# Patient Record
Sex: Female | Born: 1975 | Race: White | Hispanic: No | Marital: Married | Smoking: Never smoker
Health system: Southern US, Community
[De-identification: ages and names within clinical notes are randomized; demographics above are authoritative.]

## PROBLEM LIST (undated history)

## (undated) DIAGNOSIS — Z8042 Family history of malignant neoplasm of prostate: Secondary | ICD-10-CM

## (undated) DIAGNOSIS — Z808 Family history of malignant neoplasm of other organs or systems: Secondary | ICD-10-CM

## (undated) DIAGNOSIS — Z8049 Family history of malignant neoplasm of other genital organs: Secondary | ICD-10-CM

## (undated) DIAGNOSIS — Z800 Family history of malignant neoplasm of digestive organs: Secondary | ICD-10-CM

## (undated) HISTORY — DX: Family history of malignant neoplasm of digestive organs: Z80.0

## (undated) HISTORY — DX: Family history of malignant neoplasm of other organs or systems: Z80.8

## (undated) HISTORY — DX: Family history of malignant neoplasm of prostate: Z80.42

## (undated) HISTORY — DX: Family history of malignant neoplasm of other genital organs: Z80.49

## (undated) HISTORY — PX: MENISCUS REPAIR: SHX5179

---

## 1999-01-11 ENCOUNTER — Other Ambulatory Visit: Admission: RE | Admit: 1999-01-11 | Discharge: 1999-01-11 | Payer: Self-pay | Admitting: Obstetrics and Gynecology

## 2001-09-14 ENCOUNTER — Ambulatory Visit (HOSPITAL_COMMUNITY): Admission: RE | Admit: 2001-09-14 | Discharge: 2001-09-14 | Payer: Self-pay | Admitting: Obstetrics and Gynecology

## 2001-11-29 ENCOUNTER — Inpatient Hospital Stay (HOSPITAL_COMMUNITY): Admission: AD | Admit: 2001-11-29 | Discharge: 2001-12-02 | Payer: Self-pay | Admitting: Obstetrics and Gynecology

## 2001-12-04 ENCOUNTER — Encounter: Admission: RE | Admit: 2001-12-04 | Discharge: 2002-01-03 | Payer: Self-pay | Admitting: Obstetrics and Gynecology

## 2004-01-23 ENCOUNTER — Other Ambulatory Visit: Admission: RE | Admit: 2004-01-23 | Discharge: 2004-01-23 | Payer: Self-pay | Admitting: Obstetrics and Gynecology

## 2004-01-23 ENCOUNTER — Inpatient Hospital Stay (HOSPITAL_COMMUNITY): Admission: AD | Admit: 2004-01-23 | Discharge: 2004-01-23 | Payer: Self-pay | Admitting: Obstetrics and Gynecology

## 2004-06-11 ENCOUNTER — Ambulatory Visit (HOSPITAL_COMMUNITY): Admission: RE | Admit: 2004-06-11 | Discharge: 2004-06-11 | Payer: Self-pay | Admitting: Obstetrics and Gynecology

## 2004-08-30 ENCOUNTER — Inpatient Hospital Stay (HOSPITAL_COMMUNITY): Admission: AD | Admit: 2004-08-30 | Discharge: 2004-09-01 | Payer: Self-pay | Admitting: Obstetrics and Gynecology

## 2005-04-07 ENCOUNTER — Other Ambulatory Visit: Admission: RE | Admit: 2005-04-07 | Discharge: 2005-04-07 | Payer: Self-pay | Admitting: Obstetrics and Gynecology

## 2008-12-24 ENCOUNTER — Inpatient Hospital Stay (HOSPITAL_COMMUNITY): Admission: AD | Admit: 2008-12-24 | Discharge: 2008-12-26 | Payer: Self-pay | Admitting: Obstetrics and Gynecology

## 2010-07-30 LAB — RH IMMUNE GLOB WKUP(>/=20WKS)(NOT WOMEN'S HOSP): Fetal Screen: NEGATIVE

## 2010-07-30 LAB — CBC
HCT: 32.2 % — ABNORMAL LOW (ref 36.0–46.0)
HCT: 33.6 % — ABNORMAL LOW (ref 36.0–46.0)
HCT: 39 % (ref 36.0–46.0)
Hemoglobin: 11.2 g/dL — ABNORMAL LOW (ref 12.0–15.0)
Hemoglobin: 11.6 g/dL — ABNORMAL LOW (ref 12.0–15.0)
Hemoglobin: 13.3 g/dL (ref 12.0–15.0)
MCHC: 34.1 g/dL (ref 30.0–36.0)
MCHC: 34.4 g/dL (ref 30.0–36.0)
MCHC: 34.7 g/dL (ref 30.0–36.0)
MCV: 96.3 fL (ref 78.0–100.0)
MCV: 96.7 fL (ref 78.0–100.0)
MCV: 97.2 fL (ref 78.0–100.0)
Platelets: 113 10*3/uL — ABNORMAL LOW (ref 150–400)
Platelets: 81 10*3/uL — ABNORMAL LOW (ref 150–400)
Platelets: 92 10*3/uL — ABNORMAL LOW (ref 150–400)
RBC: 3.31 MIL/uL — ABNORMAL LOW (ref 3.87–5.11)
RBC: 3.47 MIL/uL — ABNORMAL LOW (ref 3.87–5.11)
RBC: 4.05 MIL/uL (ref 3.87–5.11)
RDW: 13.1 % (ref 11.5–15.5)
RDW: 13.3 % (ref 11.5–15.5)
RDW: 13.3 % (ref 11.5–15.5)
WBC: 5.4 10*3/uL (ref 4.0–10.5)
WBC: 7.6 10*3/uL (ref 4.0–10.5)
WBC: 9.5 10*3/uL (ref 4.0–10.5)

## 2010-07-30 LAB — RPR: RPR Ser Ql: NONREACTIVE

## 2010-09-10 NOTE — Discharge Summary (Signed)
   NAME:  Sheryl [name], Sheryl [name]                      ACCOUNT NO.:  [id number]   MEDICAL RECORD NO.:  [id number]                   PATIENT TYPE:  INP   LOCATION:  9124                                 FACILITY:  [facility]   PHYSICIAN:  Randye Lobo, M.D.                DATE OF BIRTH:  [birth year]   DATE OF ADMISSION:  11/29/2001  DATE OF DISCHARGE:  12/02/2001                                 DISCHARGE SUMMARY   FINAL DIAGNOSES:  1. Intrauterine gestation at 38-2/7ths weeks' gestation.  2. Maternal exhaustion.  3. Chorioamnionitis.   PROCEDURE:  Vacuum-assisted vaginal delivery with repair of second-degree  laceration.   SURGEON:  Randye Lobo, M.D.   COMPLICATIONS:  None.   HISTORY:  This 35 year old G1, P0 presented at 38+ weeks' gestation with  spontaneous rupture of membranes.  Patient was admitted and was about 1 cm  dilated at this point.  She progressed though the latent phase of labor and  did receive an epidural for anesthesia.  At around 8:55 that morning she was  9 cm dilated.  At this point, labor was augmented with Pitocin.  The patient  dilated complete and heart tracing remained reactive at this time.  Patient  did develop a low-grade temperature of about 100 degrees and her fever  persisted during this time.  After pushing for about 2 1/2 hours, the  patient developed maternal exhaustion and the fetal heart rate declined to  about the 170's.  At this time, the patient was diagnosed with developing  chorioamnionitis and decision was made to proceed with vacuum-assisted  delivery.  At this point, Dr. Conley Simmonds used the vacuum extractor.  The  baby was at the +3 station in the right occipital anterior position.  The  baby was delivered with two contractions and there was second-degree  laceration that was repaired.  The delivery was without complications.  The  patient's postpartum course was benign without significant fevers.  She did  have some gestational  thrombocytopenia.  The baby was circumcised before  discharge.  She was felt ready for discharge on postoperative day 2.   DISCHARGE INSTRUCTIONS:  1. She was sent home on a regular diet.  2. She was told to decrease activities.  3. She was told to continue prenatal vitamins.  4. She was told to use over-the-counter ibuprofen as needed for pain.   FOLLOW UP:  She is to follow up in the office in four weeks.      Michelle B. Bigelman, P.A.-C.             Randye Lobo, M.D.    MBB/MEDQ  D:  01/17/2002  T:  01/17/2002  Job:  [id number]

## 2010-09-10 NOTE — Op Note (Signed)
NAME:  Sheryl [name], Sheryl [name]                      ACCOUNT NO.:  [id number]   MEDICAL RECORD NO.:  [id number]                   PATIENT TYPE:  INP   LOCATION:  9124                                 FACILITY:  [facility]   PHYSICIAN:  Randye Lobo, M.D.                DATE OF BIRTH:  June 06, 1975   DATE OF PROCEDURE:  11/30/2001  DATE OF DISCHARGE:  12/02/2001                                 OPERATIVE REPORT   PREOPERATIVE DIAGNOSES:  1. Intrauterine gestation at 12 plus 2 weeks.  2. Maternal exhaustion.  3. Chorioamnionitis.   POSTOPERATIVE DIAGNOSES:  1. Intrauterine gestation at 51 plus 2 weeks.  2. Maternal exhaustion.  3. Chorioamnionitis.   PROCEDURE:  Vacuum assisted vaginal delivery with repair of second degree  laceration.   SURGEON:  Randye Lobo, M.D.   ANESTHESIA:  Epidural, local with 1% lidocaine.   ESTIMATED BLOOD LOSS:  300 cc.   URINE OUTPUT:  60 cc prior to procedure.   COMPLICATIONS:  None.   INDICATIONS FOR PROCEDURE:  The patient was a 35 year old, gravida 1, para 35  Caucasian female at 54 plus [redacted] weeks gestation, who presented on November 29, 2001 with spontaneous rupture of membranes at 18:45 on November 29, 2001. The  patient was admitted to the [facility] and had an initial cervical  examination of 1 cm of dilatation. The patient went on to progress  spontaneously through the latent phase of labor and did receive an epidural  for anesthesia. At 8:55 a.m. on November 30, 2001, the patient's cervix was  noted to be 9 cm dilated. At this time, the labor was augmented with Pitocin  intravenously. The fetal heart rate tracing throughout the patient's labor  remained reactive and reassuring. The patient achieved complete cervical  dilatation at 10:27 at which time she was noted to have a low grade fever of  100 degrees. The patient did begin pushing, and her fever was persistent  during this time. After pushing for approximately 2 1/2 hours, the patient  was  noted to develop maternal exhaustion, and the fetal heart rate was noted  to climb into the 170s. The patient was diagnosed with developing  chorioamniotic, and a recommendation was made to proceed with a vacuum  assisted vaginal delivery. The patient and her husband consent to the  procedure after the risks and benefits were reviewed.   FINDINGS:  The vertex was noted to be at the +3 station in the right occiput  anterior position.   A viable female infant was delivered at 13:18 over a second degree laceration  with Apgar of 8 at 1 minute and 9 at 5 minutes. The infant was noted to be  vigorous at birth. The placenta was noted to be intact with a succenturiate  lobe and a normal insertion of a three vessel cord.   DESCRIPTION OF PROCEDURE:  The patient's perineum was sterilely prepped and  draped, and a red rubber catheter was used to empty the patient's bladder.  The fetal vertex position was reconfirmed at this time. The patient was  injected along the perineum with 1% lidocaine. The mighty vac was then  placed over the fetal vertex, and with two contractions, and minimal effort,  the newborn was delivered over a second degree laceration. The cord was  doubly clamped and cut at this time. The newborn remained with the mother.   The placenta delivered spontaneously. The patient did receive Pitocin 20  units intravenously. The cervix and vagina were without lacerations and a  second degree perineal laceration was confirmed which was repaired in  standard fashion with #0 and 2-0 Vicryl. There was a left periurethral  oblation which was not bleeding and therefore was not repaired.   This concluded the patient's procedure. There were no complications. All  needle, instrument, and sponge counts were correct.                                                 Randye Lobo, M.D.    BES/MEDQ  D:  11/30/2001  T:  12/04/2001  Job:  [id number]

## 2016-01-29 DIAGNOSIS — Z01419 Encounter for gynecological examination (general) (routine) without abnormal findings: Secondary | ICD-10-CM | POA: Diagnosis not present

## 2016-01-29 DIAGNOSIS — Z681 Body mass index (BMI) 19 or less, adult: Secondary | ICD-10-CM | POA: Diagnosis not present

## 2016-02-11 DIAGNOSIS — M5410 Radiculopathy, site unspecified: Secondary | ICD-10-CM | POA: Diagnosis not present

## 2016-02-11 DIAGNOSIS — R0781 Pleurodynia: Secondary | ICD-10-CM | POA: Diagnosis not present

## 2016-02-11 DIAGNOSIS — Z23 Encounter for immunization: Secondary | ICD-10-CM | POA: Diagnosis not present

## 2016-02-11 DIAGNOSIS — R200 Anesthesia of skin: Secondary | ICD-10-CM | POA: Diagnosis not present

## 2016-02-12 DIAGNOSIS — Z1231 Encounter for screening mammogram for malignant neoplasm of breast: Secondary | ICD-10-CM | POA: Diagnosis not present

## 2016-02-12 DIAGNOSIS — Z681 Body mass index (BMI) 19 or less, adult: Secondary | ICD-10-CM | POA: Diagnosis not present

## 2016-02-17 ENCOUNTER — Other Ambulatory Visit: Payer: Self-pay | Admitting: Obstetrics and Gynecology

## 2016-02-17 DIAGNOSIS — R928 Other abnormal and inconclusive findings on diagnostic imaging of breast: Secondary | ICD-10-CM

## 2016-02-19 ENCOUNTER — Ambulatory Visit
Admission: RE | Admit: 2016-02-19 | Discharge: 2016-02-19 | Disposition: A | Discharge status: Home / Self Care | Payer: BLUE CROSS/BLUE SHIELD | Source: Ambulatory Visit | Attending: Obstetrics and Gynecology | Admitting: Obstetrics and Gynecology

## 2016-02-19 DIAGNOSIS — R928 Other abnormal and inconclusive findings on diagnostic imaging of breast: Secondary | ICD-10-CM

## 2016-02-19 DIAGNOSIS — N6320 Unspecified lump in the left breast, unspecified quadrant: Secondary | ICD-10-CM | POA: Diagnosis not present

## 2016-07-21 DIAGNOSIS — D2261 Melanocytic nevi of right upper limb, including shoulder: Secondary | ICD-10-CM | POA: Diagnosis not present

## 2016-07-21 DIAGNOSIS — D2262 Melanocytic nevi of left upper limb, including shoulder: Secondary | ICD-10-CM | POA: Diagnosis not present

## 2016-07-21 DIAGNOSIS — L570 Actinic keratosis: Secondary | ICD-10-CM | POA: Diagnosis not present

## 2016-07-21 DIAGNOSIS — D1801 Hemangioma of skin and subcutaneous tissue: Secondary | ICD-10-CM | POA: Diagnosis not present

## 2016-07-21 DIAGNOSIS — D225 Melanocytic nevi of trunk: Secondary | ICD-10-CM | POA: Diagnosis not present

## 2016-08-23 DIAGNOSIS — H5212 Myopia, left eye: Secondary | ICD-10-CM | POA: Diagnosis not present

## 2016-08-23 DIAGNOSIS — H52223 Regular astigmatism, bilateral: Secondary | ICD-10-CM | POA: Diagnosis not present

## 2017-02-28 DIAGNOSIS — Z01419 Encounter for gynecological examination (general) (routine) without abnormal findings: Secondary | ICD-10-CM | POA: Diagnosis not present

## 2017-02-28 DIAGNOSIS — Z681 Body mass index (BMI) 19 or less, adult: Secondary | ICD-10-CM | POA: Diagnosis not present

## 2017-02-28 DIAGNOSIS — Z1231 Encounter for screening mammogram for malignant neoplasm of breast: Secondary | ICD-10-CM | POA: Diagnosis not present

## 2017-06-22 DIAGNOSIS — M2391 Unspecified internal derangement of right knee: Secondary | ICD-10-CM | POA: Diagnosis not present

## 2017-06-22 DIAGNOSIS — M25661 Stiffness of right knee, not elsewhere classified: Secondary | ICD-10-CM | POA: Diagnosis not present

## 2017-06-22 DIAGNOSIS — M25561 Pain in right knee: Secondary | ICD-10-CM | POA: Diagnosis not present

## 2017-06-24 DIAGNOSIS — S83251A Bucket-handle tear of lateral meniscus, current injury, right knee, initial encounter: Secondary | ICD-10-CM | POA: Diagnosis not present

## 2017-06-24 DIAGNOSIS — Q686 Discoid meniscus: Secondary | ICD-10-CM | POA: Diagnosis not present

## 2017-06-24 DIAGNOSIS — M25461 Effusion, right knee: Secondary | ICD-10-CM | POA: Diagnosis not present

## 2017-06-24 DIAGNOSIS — M25561 Pain in right knee: Secondary | ICD-10-CM | POA: Diagnosis not present

## 2017-07-05 DIAGNOSIS — M25561 Pain in right knee: Secondary | ICD-10-CM | POA: Diagnosis not present

## 2017-07-05 DIAGNOSIS — S83251D Bucket-handle tear of lateral meniscus, current injury, right knee, subsequent encounter: Secondary | ICD-10-CM | POA: Diagnosis not present

## 2017-07-25 DIAGNOSIS — D2371 Other benign neoplasm of skin of right lower limb, including hip: Secondary | ICD-10-CM | POA: Diagnosis not present

## 2017-07-25 DIAGNOSIS — L819 Disorder of pigmentation, unspecified: Secondary | ICD-10-CM | POA: Diagnosis not present

## 2017-07-25 DIAGNOSIS — D2261 Melanocytic nevi of right upper limb, including shoulder: Secondary | ICD-10-CM | POA: Diagnosis not present

## 2017-07-25 DIAGNOSIS — D2262 Melanocytic nevi of left upper limb, including shoulder: Secondary | ICD-10-CM | POA: Diagnosis not present

## 2017-11-30 DIAGNOSIS — K625 Hemorrhage of anus and rectum: Secondary | ICD-10-CM | POA: Diagnosis not present

## 2017-12-12 DIAGNOSIS — K648 Other hemorrhoids: Secondary | ICD-10-CM | POA: Diagnosis not present

## 2017-12-12 DIAGNOSIS — Z8371 Family history of colonic polyps: Secondary | ICD-10-CM | POA: Diagnosis not present

## 2018-01-16 DIAGNOSIS — K625 Hemorrhage of anus and rectum: Secondary | ICD-10-CM | POA: Diagnosis not present

## 2018-01-26 DIAGNOSIS — M25561 Pain in right knee: Secondary | ICD-10-CM | POA: Diagnosis not present

## 2018-02-20 DIAGNOSIS — Z130 Encounter for screening for diseases of the blood and blood-forming organs and certain disorders involving the immune mechanism: Secondary | ICD-10-CM | POA: Diagnosis not present

## 2018-02-20 DIAGNOSIS — Z13220 Encounter for screening for lipoid disorders: Secondary | ICD-10-CM | POA: Diagnosis not present

## 2018-02-20 DIAGNOSIS — Z23 Encounter for immunization: Secondary | ICD-10-CM | POA: Diagnosis not present

## 2018-02-20 DIAGNOSIS — Z0000 Encounter for general adult medical examination without abnormal findings: Secondary | ICD-10-CM | POA: Diagnosis not present

## 2018-03-08 DIAGNOSIS — W502XXA Accidental twist by another person, initial encounter: Secondary | ICD-10-CM | POA: Diagnosis not present

## 2018-03-08 DIAGNOSIS — S83281A Other tear of lateral meniscus, current injury, right knee, initial encounter: Secondary | ICD-10-CM | POA: Diagnosis not present

## 2018-05-02 DIAGNOSIS — M25561 Pain in right knee: Secondary | ICD-10-CM | POA: Diagnosis not present

## 2018-05-02 DIAGNOSIS — M25661 Stiffness of right knee, not elsewhere classified: Secondary | ICD-10-CM | POA: Diagnosis not present

## 2018-05-16 DIAGNOSIS — M2391 Unspecified internal derangement of right knee: Secondary | ICD-10-CM | POA: Diagnosis not present

## 2018-05-16 DIAGNOSIS — M25561 Pain in right knee: Secondary | ICD-10-CM | POA: Diagnosis not present

## 2018-05-16 DIAGNOSIS — M25661 Stiffness of right knee, not elsewhere classified: Secondary | ICD-10-CM | POA: Diagnosis not present

## 2018-05-30 DIAGNOSIS — M25561 Pain in right knee: Secondary | ICD-10-CM | POA: Diagnosis not present

## 2018-05-30 DIAGNOSIS — M25661 Stiffness of right knee, not elsewhere classified: Secondary | ICD-10-CM | POA: Diagnosis not present

## 2018-06-20 DIAGNOSIS — Z681 Body mass index (BMI) 19 or less, adult: Secondary | ICD-10-CM | POA: Diagnosis not present

## 2018-06-20 DIAGNOSIS — Z1231 Encounter for screening mammogram for malignant neoplasm of breast: Secondary | ICD-10-CM | POA: Diagnosis not present

## 2018-06-20 DIAGNOSIS — Z01419 Encounter for gynecological examination (general) (routine) without abnormal findings: Secondary | ICD-10-CM | POA: Diagnosis not present

## 2018-06-20 DIAGNOSIS — Z124 Encounter for screening for malignant neoplasm of cervix: Secondary | ICD-10-CM | POA: Diagnosis not present

## 2018-07-31 DIAGNOSIS — D225 Melanocytic nevi of trunk: Secondary | ICD-10-CM | POA: Diagnosis not present

## 2018-07-31 DIAGNOSIS — D2371 Other benign neoplasm of skin of right lower limb, including hip: Secondary | ICD-10-CM | POA: Diagnosis not present

## 2018-07-31 DIAGNOSIS — L84 Corns and callosities: Secondary | ICD-10-CM | POA: Diagnosis not present

## 2018-07-31 DIAGNOSIS — D2372 Other benign neoplasm of skin of left lower limb, including hip: Secondary | ICD-10-CM | POA: Diagnosis not present

## 2019-02-25 DIAGNOSIS — Z13220 Encounter for screening for lipoid disorders: Secondary | ICD-10-CM | POA: Diagnosis not present

## 2019-02-25 DIAGNOSIS — Z23 Encounter for immunization: Secondary | ICD-10-CM | POA: Diagnosis not present

## 2019-02-25 DIAGNOSIS — Z131 Encounter for screening for diabetes mellitus: Secondary | ICD-10-CM | POA: Diagnosis not present

## 2019-02-25 DIAGNOSIS — Z0000 Encounter for general adult medical examination without abnormal findings: Secondary | ICD-10-CM | POA: Diagnosis not present

## 2019-05-01 DIAGNOSIS — Z1159 Encounter for screening for other viral diseases: Secondary | ICD-10-CM | POA: Diagnosis not present

## 2019-07-29 DIAGNOSIS — N898 Other specified noninflammatory disorders of vagina: Secondary | ICD-10-CM | POA: Diagnosis not present

## 2019-07-29 DIAGNOSIS — Z681 Body mass index (BMI) 19 or less, adult: Secondary | ICD-10-CM | POA: Diagnosis not present

## 2019-07-29 DIAGNOSIS — Z01419 Encounter for gynecological examination (general) (routine) without abnormal findings: Secondary | ICD-10-CM | POA: Diagnosis not present

## 2019-07-29 DIAGNOSIS — Z1231 Encounter for screening mammogram for malignant neoplasm of breast: Secondary | ICD-10-CM | POA: Diagnosis not present

## 2019-08-14 DIAGNOSIS — D2262 Melanocytic nevi of left upper limb, including shoulder: Secondary | ICD-10-CM | POA: Diagnosis not present

## 2019-08-14 DIAGNOSIS — D2261 Melanocytic nevi of right upper limb, including shoulder: Secondary | ICD-10-CM | POA: Diagnosis not present

## 2019-08-14 DIAGNOSIS — D2372 Other benign neoplasm of skin of left lower limb, including hip: Secondary | ICD-10-CM | POA: Diagnosis not present

## 2019-08-14 DIAGNOSIS — D225 Melanocytic nevi of trunk: Secondary | ICD-10-CM | POA: Diagnosis not present

## 2019-08-30 DIAGNOSIS — Z20828 Contact with and (suspected) exposure to other viral communicable diseases: Secondary | ICD-10-CM | POA: Diagnosis not present

## 2019-08-30 DIAGNOSIS — Z03818 Encounter for observation for suspected exposure to other biological agents ruled out: Secondary | ICD-10-CM | POA: Diagnosis not present

## 2019-09-03 DIAGNOSIS — Z03818 Encounter for observation for suspected exposure to other biological agents ruled out: Secondary | ICD-10-CM | POA: Diagnosis not present

## 2019-12-21 ENCOUNTER — Ambulatory Visit: Payer: Self-pay | Attending: Internal Medicine

## 2019-12-21 DIAGNOSIS — Z23 Encounter for immunization: Secondary | ICD-10-CM

## 2019-12-21 NOTE — Progress Notes (Signed)
   Covid-19 Vaccination Clinic  Name:  Sheryl [name]    MRN: [id number] DOB: 1975/07/04  12/21/2019  Sheryl [name] was observed post Covid-19 immunization for 15 minutes without incident. She was provided with Vaccine Information Sheet and instruction to access the V-Safe system.   Sheryl [name] was instructed to call 911 with any severe reactions post vaccine: Marland Kitchen Difficulty breathing  . Swelling of face and throat  . A fast heartbeat  . A bad rash all over body  . Dizziness and weakness   Immunizations Administered    Name Date Dose VIS Date Route   Pfizer COVID-19 Vaccine 12/21/2019 12:24 PM 0.3 mL 06/19/2018 Intramuscular   Manufacturer: [employer], [employer]   Lot: [id number]   NDC: [address]-7

## 2020-01-13 ENCOUNTER — Ambulatory Visit: Payer: Self-pay | Attending: Internal Medicine

## 2020-01-13 DIAGNOSIS — Z23 Encounter for immunization: Secondary | ICD-10-CM

## 2020-01-13 NOTE — Progress Notes (Signed)
   Covid-19 Vaccination Clinic  Name:  Sheryl [name]    MRN: [id number] DOB: [birth year]  01/13/2020  [name] was observed post Covid-19 immunization for 15 minutes without incident. She was provided with Vaccine Information Sheet and instruction to access the V-Safe system.  Vaccinated by Fredirick Maudlin  [name] was instructed to call 911 with any severe reactions post vaccine: Marland Kitchen Difficulty breathing  . Swelling of face and throat  . A fast heartbeat  . A bad rash all over body  . Dizziness and weakness   Immunizations Administered    Name Date Dose VIS Date Route   Pfizer COVID-19 Vaccine 01/13/2020 12:53 PM 0.3 mL 06/19/2018 Intramuscular   Manufacturer: [employer], [employer]   Lot: [id number]   NDC: [id number]

## 2020-01-17 MED FILL — PFIZER-BIONTECH COVID-19 VA: 30 | 1 days supply | Qty: 0 | Fill #0

## 2020-03-12 DIAGNOSIS — Z131 Encounter for screening for diabetes mellitus: Secondary | ICD-10-CM | POA: Diagnosis not present

## 2020-03-12 DIAGNOSIS — Z23 Encounter for immunization: Secondary | ICD-10-CM | POA: Diagnosis not present

## 2020-03-12 DIAGNOSIS — Z13220 Encounter for screening for lipoid disorders: Secondary | ICD-10-CM | POA: Diagnosis not present

## 2020-03-12 DIAGNOSIS — Z0000 Encounter for general adult medical examination without abnormal findings: Secondary | ICD-10-CM | POA: Diagnosis not present

## 2020-07-28 DIAGNOSIS — D224 Melanocytic nevi of scalp and neck: Secondary | ICD-10-CM | POA: Diagnosis not present

## 2020-07-28 DIAGNOSIS — D2261 Melanocytic nevi of right upper limb, including shoulder: Secondary | ICD-10-CM | POA: Diagnosis not present

## 2020-07-28 DIAGNOSIS — D225 Melanocytic nevi of trunk: Secondary | ICD-10-CM | POA: Diagnosis not present

## 2020-07-28 DIAGNOSIS — D2262 Melanocytic nevi of left upper limb, including shoulder: Secondary | ICD-10-CM | POA: Diagnosis not present

## 2020-08-11 DIAGNOSIS — Z1231 Encounter for screening mammogram for malignant neoplasm of breast: Secondary | ICD-10-CM | POA: Diagnosis not present

## 2020-08-11 DIAGNOSIS — Z01419 Encounter for gynecological examination (general) (routine) without abnormal findings: Secondary | ICD-10-CM | POA: Diagnosis not present

## 2020-08-11 DIAGNOSIS — Z681 Body mass index (BMI) 19 or less, adult: Secondary | ICD-10-CM | POA: Diagnosis not present

## 2020-08-12 ENCOUNTER — Other Ambulatory Visit: Payer: Self-pay | Admitting: Obstetrics and Gynecology

## 2020-08-12 DIAGNOSIS — R928 Other abnormal and inconclusive findings on diagnostic imaging of breast: Secondary | ICD-10-CM

## 2020-08-21 ENCOUNTER — Ambulatory Visit
Admission: RE | Admit: 2020-08-21 | Discharge: 2020-08-21 | Disposition: A | Discharge status: Home / Self Care | Payer: BC Managed Care – PPO | Source: Ambulatory Visit | Attending: Obstetrics and Gynecology | Admitting: Obstetrics and Gynecology

## 2020-08-21 ENCOUNTER — Other Ambulatory Visit: Payer: Self-pay

## 2020-08-21 DIAGNOSIS — R922 Inconclusive mammogram: Secondary | ICD-10-CM | POA: Diagnosis not present

## 2020-08-21 DIAGNOSIS — R928 Other abnormal and inconclusive findings on diagnostic imaging of breast: Secondary | ICD-10-CM

## 2020-08-21 DIAGNOSIS — N6011 Diffuse cystic mastopathy of right breast: Secondary | ICD-10-CM | POA: Diagnosis not present

## 2020-08-21 DIAGNOSIS — N6012 Diffuse cystic mastopathy of left breast: Secondary | ICD-10-CM | POA: Diagnosis not present

## 2020-09-04 ENCOUNTER — Other Ambulatory Visit: Payer: Self-pay

## 2020-11-08 ENCOUNTER — Emergency Department (HOSPITAL_COMMUNITY): Payer: BC Managed Care – PPO

## 2020-11-08 ENCOUNTER — Other Ambulatory Visit: Payer: Self-pay

## 2020-11-08 ENCOUNTER — Emergency Department (HOSPITAL_COMMUNITY)
Admission: EM | Admit: 2020-11-08 | Discharge: 2020-11-08 | Disposition: A | Discharge status: Home / Self Care | Payer: BC Managed Care – PPO | Attending: Emergency Medicine | Admitting: Emergency Medicine

## 2020-11-08 ENCOUNTER — Encounter (HOSPITAL_COMMUNITY): Payer: Self-pay

## 2020-11-08 DIAGNOSIS — N201 Calculus of ureter: Secondary | ICD-10-CM | POA: Diagnosis not present

## 2020-11-08 DIAGNOSIS — R109 Unspecified abdominal pain: Secondary | ICD-10-CM | POA: Diagnosis not present

## 2020-11-08 DIAGNOSIS — K449 Diaphragmatic hernia without obstruction or gangrene: Secondary | ICD-10-CM | POA: Diagnosis not present

## 2020-11-08 DIAGNOSIS — N9489 Other specified conditions associated with female genital organs and menstrual cycle: Secondary | ICD-10-CM | POA: Diagnosis not present

## 2020-11-08 DIAGNOSIS — N132 Hydronephrosis with renal and ureteral calculous obstruction: Secondary | ICD-10-CM | POA: Diagnosis not present

## 2020-11-08 LAB — COMPREHENSIVE METABOLIC PANEL
ALT: 16 U/L (ref 0–44)
AST: 17 U/L (ref 15–41)
Albumin: 4.4 g/dL (ref 3.5–5.0)
Alkaline Phosphatase: 36 U/L — ABNORMAL LOW (ref 38–126)
Anion gap: 7 (ref 5–15)
BUN: 13 mg/dL (ref 6–20)
CO2: 26 mmol/L (ref 22–32)
Calcium: 9.3 mg/dL (ref 8.9–10.3)
Chloride: 107 mmol/L (ref 98–111)
Creatinine, Ser: 0.76 mg/dL (ref 0.44–1.00)
GFR, Estimated: >60 mL/min (ref >60)
Glucose, Bld: 118 mg/dL — ABNORMAL HIGH (ref 70–99)
Potassium: 4 mmol/L (ref 3.5–5.1)
Sodium: 140 mmol/L (ref 135–145)
Total Bilirubin: 0.5 mg/dL (ref 0.3–1.2)
Total Protein: 7.3 g/dL (ref 6.5–8.1)

## 2020-11-08 LAB — URINALYSIS, ROUTINE W REFLEX MICROSCOPIC
Bacteria, UA: NONE SEEN
Bilirubin Urine: NEGATIVE
Glucose, UA: NEGATIVE mg/dL
Ketones, ur: 80 mg/dL — AB
Leukocytes,Ua: NEGATIVE
Nitrite: NEGATIVE
Protein, ur: NEGATIVE mg/dL
RBC / HPF: >50 RBC/hpf — ABNORMAL HIGH (ref 0–5)
Specific Gravity, Urine: 1.025 (ref 1.005–1.030)
pH: 5 (ref 5.0–8.0)

## 2020-11-08 LAB — CBC
HCT: 41.5 % (ref 36.0–46.0)
Hemoglobin: 13.4 g/dL (ref 12.0–15.0)
MCH: 30.8 pg (ref 26.0–34.0)
MCHC: 32.3 g/dL (ref 30.0–36.0)
MCV: 95.4 fL (ref 80.0–100.0)
Platelets: 208 10*3/uL (ref 150–400)
RBC: 4.35 MIL/uL (ref 3.87–5.11)
RDW: 13 % (ref 11.5–15.5)
WBC: 12.8 10*3/uL — ABNORMAL HIGH (ref 4.0–10.5)
nRBC: 0 % (ref 0.0–0.2)

## 2020-11-08 LAB — I-STAT BETA HCG BLOOD, ED (MC, WL, AP ONLY): I-stat hCG, quantitative: <5 m[IU]/mL (ref <5)

## 2020-11-08 MED ORDER — ONDANSETRON HCL 4 MG/2ML IJ SOLN
4.0000 mg | Freq: Once | INTRAMUSCULAR | Status: AC
  Administered 2020-11-08: 4 mg via INTRAVENOUS
  Filled 2020-11-08: qty 2

## 2020-11-08 MED ORDER — OXYCODONE-ACETAMINOPHEN 5-325 MG PO TABS: 1.0000 | ORAL_TABLET | Freq: Four times a day (QID) | ORAL | 0 refills | Status: DC | PRN

## 2020-11-08 MED ORDER — ONDANSETRON 8 MG PO TBDP: 8.0000 mg | ORAL_TABLET | Freq: Three times a day (TID) | ORAL | 0 refills | Status: DC | PRN

## 2020-11-08 MED ORDER — KETOROLAC TROMETHAMINE 15 MG/ML IJ SOLN
15.0000 mg | Freq: Once | INTRAMUSCULAR | Status: AC
  Administered 2020-11-08: 15 mg via INTRAVENOUS
  Filled 2020-11-08: qty 1

## 2020-11-08 MED ORDER — TAMSULOSIN HCL 0.4 MG PO CAPS: 0.4000 mg | ORAL_CAPSULE | Freq: Every day | ORAL | 0 refills | Status: DC

## 2020-11-08 NOTE — ED Triage Notes (Signed)
Pt reports severe left sided flank pain and vomiting that began about an hour ago.

## 2020-11-08 NOTE — ED Provider Notes (Signed)
[facility] [place] [facility]EMERGENCY DEPT Provider Note   CSN: [id number] Arrival date & time: 11/08/20  1927     History Chief Complaint  Patient presents with   Flank Pain    Sheryl [name] is a 45 y.o. female.  HPI 45 year old female presents today complaining of left flank pain.  She states this began about 1 hour ago.  She describes it as sharp in nature.  She has associated nausea and vomiting.  It is in the left flank and radiates down.  She denies any previous symptoms in the past.  She began her menstrual cycle today and is normal.  A normal time.  She does describe some "strange sensation around the external urethra.  However she denies any pain or frequency of urination.  She has not noted any blood or discolored urine.  Pain is only 8 out of 10.  She has no significant increasing or decreasing factors.  She attempted to take ibuprofen at home and feels that she likely vomited this.    History reviewed. No pertinent past medical history.  There are no problems to display for this patient.   History reviewed. No pertinent surgical history.   OB History   No obstetric history on file.     History reviewed. No pertinent family history.     Home Medications Prior to Admission medications   Not on File    Allergies    Patient has no known allergies.  Review of Systems   Review of Systems  All other systems reviewed and are negative.  Physical Exam Updated Vital Signs BP 109/67 (BP Location: Left Arm)   Pulse 71   Temp 97.9 F (36.6 C) (Oral)   Resp 16   Ht 1.676 m (5\' 6" )   Wt 45.4 kg   SpO2 100%   BMI 16.14 kg/m   Physical Exam Vitals and nursing note reviewed.  Constitutional:      Appearance: Normal appearance.  HENT:     Head: Normocephalic.     Right Ear: External ear normal.     Left Ear: External ear normal.     Nose: Nose normal.     Mouth/Throat:     Pharynx: Oropharynx is clear.  Eyes:     Pupils: Pupils are equal,  round, and reactive to light.  Cardiovascular:     Rate and Rhythm: Normal rate and regular rhythm.  Pulmonary:     Effort: Pulmonary effort is normal.     Breath sounds: Normal breath sounds.  Abdominal:     General: Abdomen is flat. Bowel sounds are normal. There is no distension.     Palpations: Abdomen is soft.     Tenderness: There is no abdominal tenderness.  Musculoskeletal:        General: Normal range of motion.     Cervical back: Normal range of motion.  Skin:    General: Skin is warm and dry.     Capillary Refill: Capillary refill takes less than 2 seconds.  Neurological:     General: No focal deficit present.     Mental Status: She is alert.  Psychiatric:        Mood and Affect: Mood normal.    ED Results / Procedures / Treatments   Labs (all labs ordered are listed, but only abnormal results are displayed) Labs Reviewed  URINALYSIS, ROUTINE W REFLEX MICROSCOPIC  CBC  COMPREHENSIVE METABOLIC PANEL  I-STAT BETA HCG BLOOD, ED ([facility], [facility], [facility] ONLY)  EKG None  Radiology No results found.  Procedures Procedures   Medications Ordered in ED Medications  ketorolac (TORADOL) 15 MG/ML injection 15 mg (has no administration in time range)  ondansetron (ZOFRAN) injection 4 mg (has no administration in time range)    ED Course  I have reviewed the triage vital signs and the nursing notes.  Pertinent labs & imaging results that were available during my care of the patient were reviewed by me and considered in my medical decision making (see chart for details).    MDM Rules/Calculators/A&P                          45 year old female presents today complaining of left flank pain.  Here she has hematuria and a 3 mm left ureteral stone.  She is treated symptomatically with Toradol and Zofran and feels improved.  Plan discharge to home with pain medications, antiemetics, and follow-up with urology. Final Clinical Impression(s) / ED Diagnoses Final diagnoses:  Left  ureteral stone    Rx / DC Orders ED Discharge Orders     None        Margarita Grizzle, MD 11/08/20 2151

## 2020-12-17 DIAGNOSIS — N202 Calculus of kidney with calculus of ureter: Secondary | ICD-10-CM | POA: Diagnosis not present

## 2021-01-08 DIAGNOSIS — N202 Calculus of kidney with calculus of ureter: Secondary | ICD-10-CM | POA: Diagnosis not present

## 2021-03-23 DIAGNOSIS — Z23 Encounter for immunization: Secondary | ICD-10-CM | POA: Diagnosis not present

## 2021-03-23 DIAGNOSIS — Z0000 Encounter for general adult medical examination without abnormal findings: Secondary | ICD-10-CM | POA: Diagnosis not present

## 2021-03-23 DIAGNOSIS — Z13220 Encounter for screening for lipoid disorders: Secondary | ICD-10-CM | POA: Diagnosis not present

## 2021-03-23 DIAGNOSIS — Z131 Encounter for screening for diabetes mellitus: Secondary | ICD-10-CM | POA: Diagnosis not present

## 2021-03-30 DIAGNOSIS — K635 Polyp of colon: Secondary | ICD-10-CM | POA: Diagnosis not present

## 2021-03-30 DIAGNOSIS — Z1211 Encounter for screening for malignant neoplasm of colon: Secondary | ICD-10-CM | POA: Diagnosis not present

## 2021-03-30 DIAGNOSIS — K648 Other hemorrhoids: Secondary | ICD-10-CM | POA: Diagnosis not present

## 2021-07-02 DIAGNOSIS — N202 Calculus of kidney with calculus of ureter: Secondary | ICD-10-CM | POA: Diagnosis not present

## 2021-07-28 DIAGNOSIS — D2261 Melanocytic nevi of right upper limb, including shoulder: Secondary | ICD-10-CM | POA: Diagnosis not present

## 2021-07-28 DIAGNOSIS — L720 Epidermal cyst: Secondary | ICD-10-CM | POA: Diagnosis not present

## 2021-07-28 DIAGNOSIS — D225 Melanocytic nevi of trunk: Secondary | ICD-10-CM | POA: Diagnosis not present

## 2021-07-28 DIAGNOSIS — D2262 Melanocytic nevi of left upper limb, including shoulder: Secondary | ICD-10-CM | POA: Diagnosis not present

## 2021-08-20 DIAGNOSIS — Z1231 Encounter for screening mammogram for malignant neoplasm of breast: Secondary | ICD-10-CM | POA: Diagnosis not present

## 2021-08-20 DIAGNOSIS — Z681 Body mass index (BMI) 19 or less, adult: Secondary | ICD-10-CM | POA: Diagnosis not present

## 2021-08-20 DIAGNOSIS — Z01419 Encounter for gynecological examination (general) (routine) without abnormal findings: Secondary | ICD-10-CM | POA: Diagnosis not present

## 2021-09-29 IMAGING — CT CT RENAL STONE PROTOCOL
2 of 4 series · 16 of 46 positions shown, 18 images · non-contrast
Comparison: None.

CLINICAL DATA: Severe left flank pain, vomiting

EXAM:
CT ABDOMEN AND PELVIS WITHOUT CONTRAST
TECHNIQUE: Multidetector CT imaging of the abdomen and pelvis was performed
following the standard protocol without IV contrast.

[Series 2: axial st · axial · 0.64mm/px · z∈[-491,-116]mm · 13 of 85 slices shown, 15 images]
[im 5/85  soft-tissue]
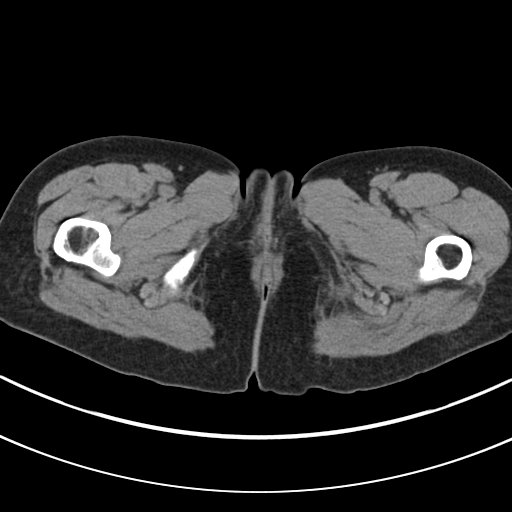
[im 5/85  bone]
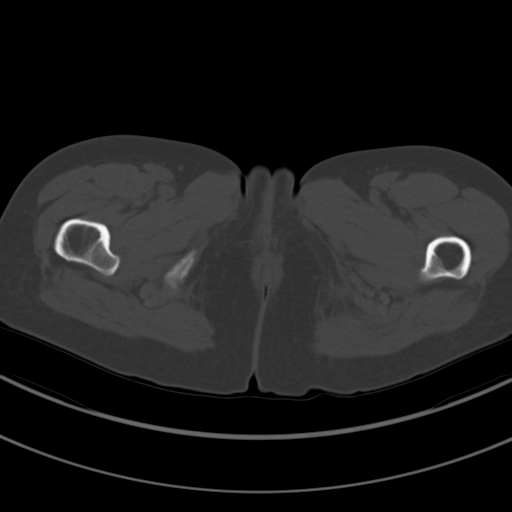
[im 14/85  soft-tissue]
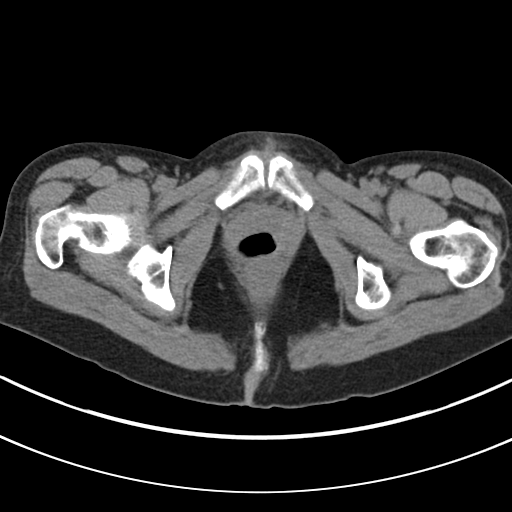
[im 18/85  soft-tissue]
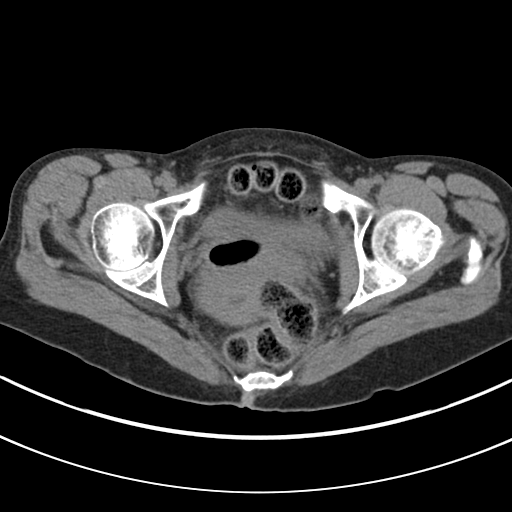
[im 23/85  soft-tissue]
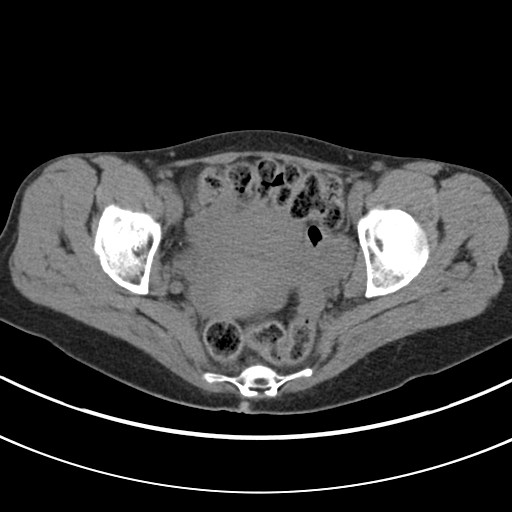
[im 31/85  soft-tissue]
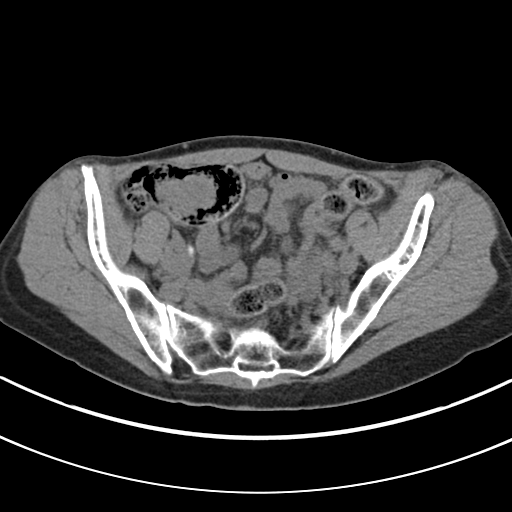
[im 36/85  soft-tissue]
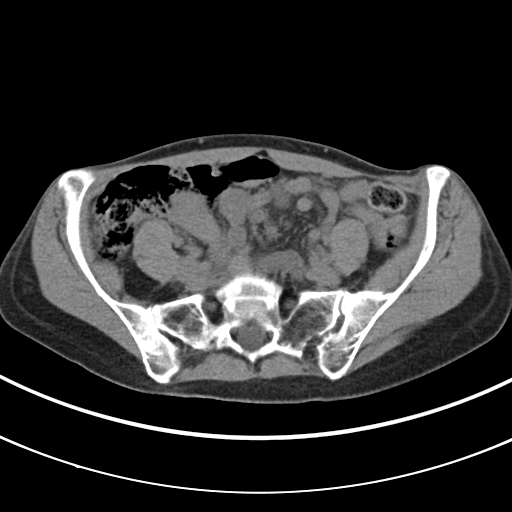
[im 45/85  soft-tissue]
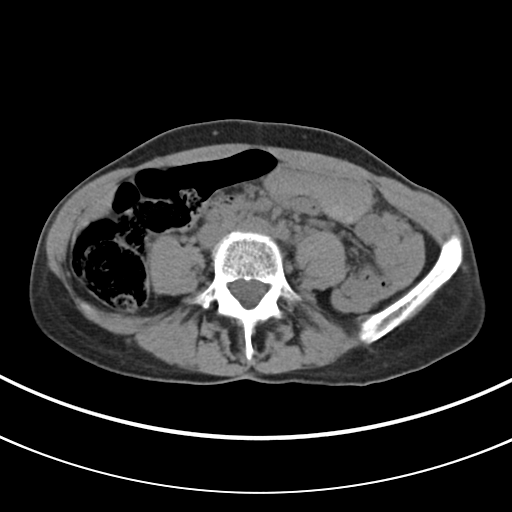
[im 49/85  soft-tissue]
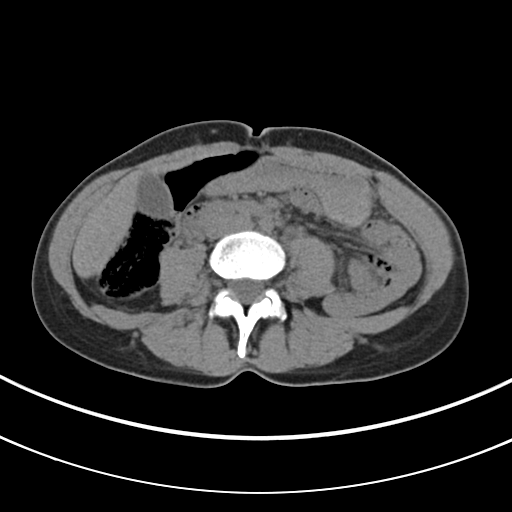
[im 54/85  soft-tissue]
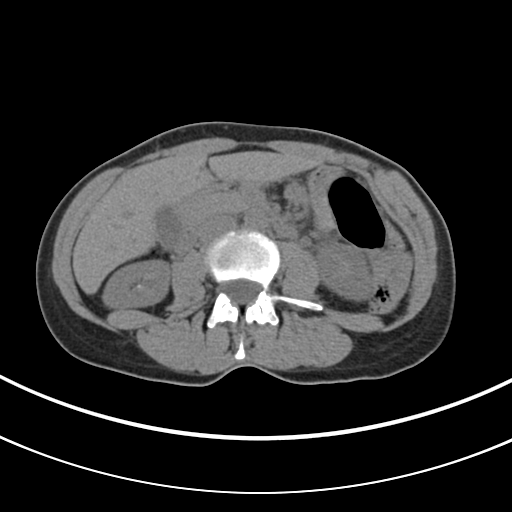
[im 54/85  bone]
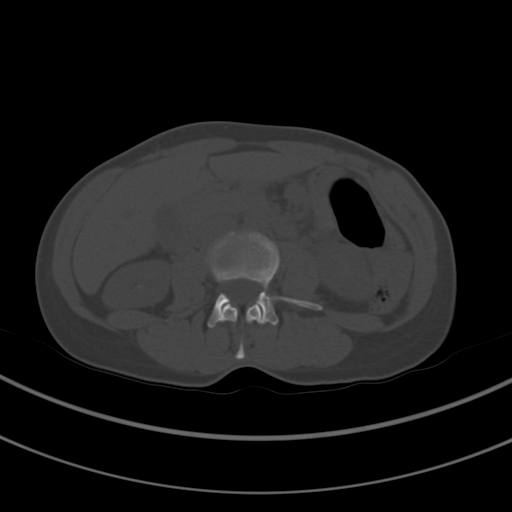
[im 62/85  soft-tissue]
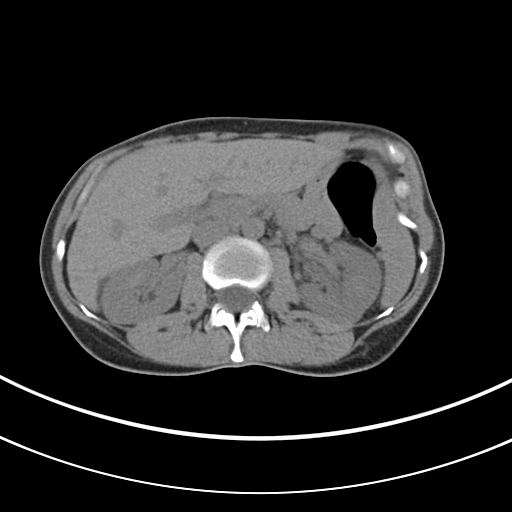
[im 67/85  soft-tissue]
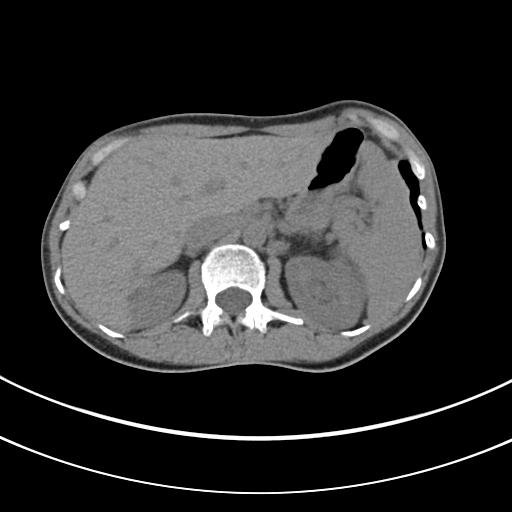
[im 71/85  soft-tissue]
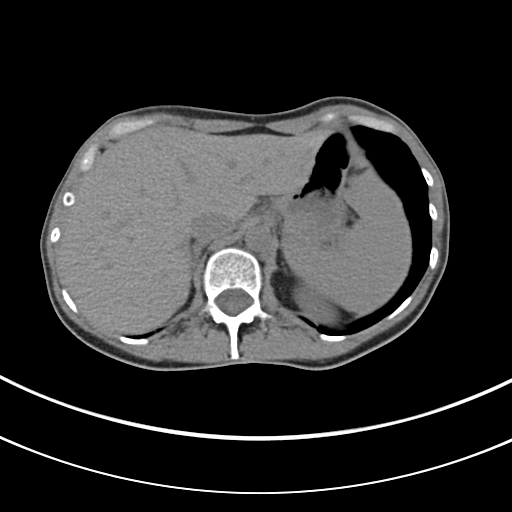
[im 80/85  soft-tissue]
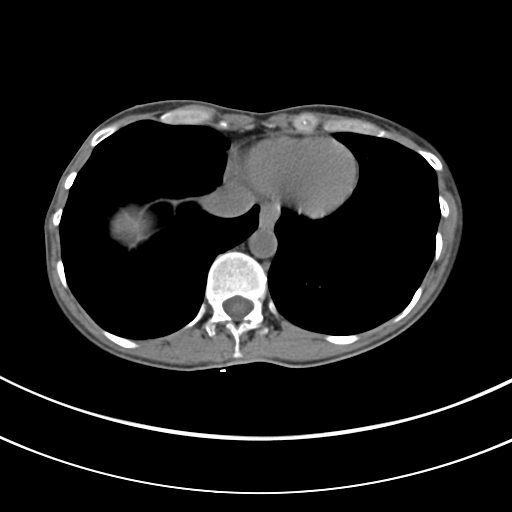

[Series 5: coronal · coronal · 0.68mm/px · 3 of 111 slices shown]
[im 37/111  soft-tissue]
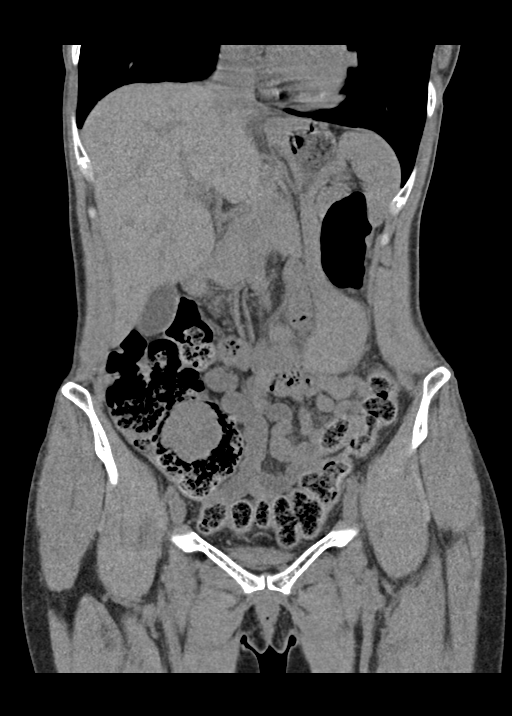
[im 49/111  soft-tissue]
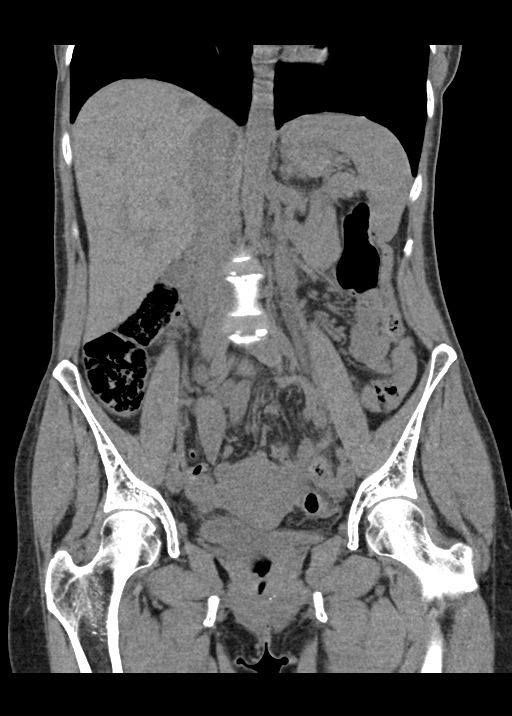
[im 62/111  soft-tissue]
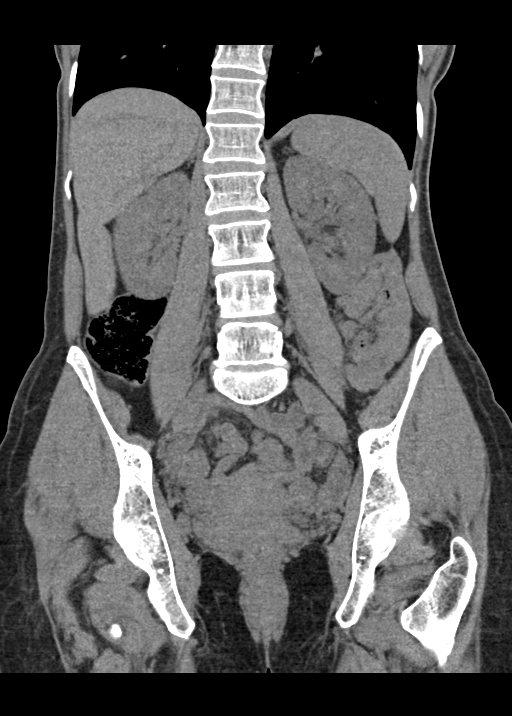

[16 of 46 positions shown; findings below may reference images not displayed]

FINDINGS: Lower chest: Lung bases are clear.

Hepatobiliary: Unenhanced liver is unremarkable.

Gallbladder is unremarkable. No intrahepatic or extrahepatic ductal
dilatation.

Pancreas: Within normal limits.

Spleen: Within normal limits.

Adrenals/Urinary Tract: Adrenal glands are within normal limits.

Two 2 mm nonobstructing right lower pole renal calculi (coronal
images 66-67). Left kidney is within normal limits. Suspected mild
left hydroureteronephrosis with associated 3 mm distal left ureteral
calculus at the UVJ (series 2/image 87).

Bladder is underdistended.

Stomach/Bowel: Stomach is notable for a tiny hiatal hernia.

No evidence of bowel obstruction.

Normal appendix (series 2/image 86).

No colonic wall thickening or inflammatory changes.

Vascular/Lymphatic: No evidence of abdominal aortic aneurysm.

No suspicious abdominopelvic lymphadenopathy.

Reproductive: Uterus is within normal limits.

Bilateral ovaries are within normal limits.

Other: No abdominopelvic ascites.

Musculoskeletal: Visualized osseous structures are within normal
limits.
IMPRESSION: 3 mm distal left ureteral calculus at the UVJ. Associated mild left
hydroureteronephrosis.

Two nonobstructing right lower pole renal calculi measuring up to 2
mm.

## 2022-04-26 DIAGNOSIS — Z0000 Encounter for general adult medical examination without abnormal findings: Secondary | ICD-10-CM | POA: Diagnosis not present

## 2022-04-26 DIAGNOSIS — Z13220 Encounter for screening for lipoid disorders: Secondary | ICD-10-CM | POA: Diagnosis not present

## 2022-04-26 DIAGNOSIS — Z1231 Encounter for screening mammogram for malignant neoplasm of breast: Secondary | ICD-10-CM | POA: Diagnosis not present

## 2022-04-26 DIAGNOSIS — Z23 Encounter for immunization: Secondary | ICD-10-CM | POA: Diagnosis not present

## 2022-05-13 ENCOUNTER — Telehealth: Payer: Self-pay | Admitting: *Deleted

## 2022-05-13 NOTE — Patient Outreach (Signed)
  Care Coordination   05/13/2022 Name: Sheryl [name] MRN: [id number] DOB: 06/29/1975   Care Coordination Outreach Attempts:  An unsuccessful telephone outreach was attempted today to offer the patient information about available care coordination services as a benefit of their health plan.   Follow Up Plan:  Additional outreach attempts will be made to offer the patient care coordination information and services.   Encounter Outcome:  No Answer   Care Coordination Interventions:  No, not indicated    Raina Mina, RN Care Management Coordinator [place] Office [phone]

## 2022-08-24 DIAGNOSIS — Z01419 Encounter for gynecological examination (general) (routine) without abnormal findings: Secondary | ICD-10-CM | POA: Diagnosis not present

## 2022-08-24 DIAGNOSIS — Z1231 Encounter for screening mammogram for malignant neoplasm of breast: Secondary | ICD-10-CM | POA: Diagnosis not present

## 2022-09-22 DIAGNOSIS — D2262 Melanocytic nevi of left upper limb, including shoulder: Secondary | ICD-10-CM | POA: Diagnosis not present

## 2022-09-22 DIAGNOSIS — L578 Other skin changes due to chronic exposure to nonionizing radiation: Secondary | ICD-10-CM | POA: Diagnosis not present

## 2022-09-22 DIAGNOSIS — L821 Other seborrheic keratosis: Secondary | ICD-10-CM | POA: Diagnosis not present

## 2022-09-22 DIAGNOSIS — I788 Other diseases of capillaries: Secondary | ICD-10-CM | POA: Diagnosis not present

## 2022-09-22 DIAGNOSIS — B070 Plantar wart: Secondary | ICD-10-CM | POA: Diagnosis not present

## 2023-04-28 DIAGNOSIS — Z13220 Encounter for screening for lipoid disorders: Secondary | ICD-10-CM | POA: Diagnosis not present

## 2023-04-28 DIAGNOSIS — Z0000 Encounter for general adult medical examination without abnormal findings: Secondary | ICD-10-CM | POA: Diagnosis not present

## 2023-04-28 DIAGNOSIS — Z23 Encounter for immunization: Secondary | ICD-10-CM | POA: Diagnosis not present

## 2023-09-15 DIAGNOSIS — Z01419 Encounter for gynecological examination (general) (routine) without abnormal findings: Secondary | ICD-10-CM | POA: Diagnosis not present

## 2023-09-15 DIAGNOSIS — Z124 Encounter for screening for malignant neoplasm of cervix: Secondary | ICD-10-CM | POA: Diagnosis not present

## 2023-09-15 DIAGNOSIS — R350 Frequency of micturition: Secondary | ICD-10-CM | POA: Diagnosis not present

## 2023-09-28 DIAGNOSIS — Z1231 Encounter for screening mammogram for malignant neoplasm of breast: Secondary | ICD-10-CM | POA: Diagnosis not present

## 2023-10-05 ENCOUNTER — Other Ambulatory Visit: Payer: Self-pay | Admitting: Obstetrics and Gynecology

## 2023-10-05 DIAGNOSIS — R928 Other abnormal and inconclusive findings on diagnostic imaging of breast: Secondary | ICD-10-CM

## 2023-10-09 ENCOUNTER — Other Ambulatory Visit: Payer: Self-pay | Admitting: Obstetrics and Gynecology

## 2023-10-09 ENCOUNTER — Ambulatory Visit
Admission: RE | Admit: 2023-10-09 | Discharge: 2023-10-09 | Disposition: A | Discharge status: Home / Self Care | Source: Ambulatory Visit | Attending: Obstetrics and Gynecology | Admitting: Obstetrics and Gynecology

## 2023-10-09 ENCOUNTER — Ambulatory Visit
Admission: RE | Admit: 2023-10-09 | Discharge: 2023-10-09 | Discharge status: Home / Self Care | Source: Ambulatory Visit | Attending: Obstetrics and Gynecology | Admitting: Obstetrics and Gynecology

## 2023-10-09 DIAGNOSIS — R928 Other abnormal and inconclusive findings on diagnostic imaging of breast: Secondary | ICD-10-CM

## 2023-10-09 DIAGNOSIS — N6322 Unspecified lump in the left breast, upper inner quadrant: Secondary | ICD-10-CM | POA: Diagnosis not present

## 2023-10-09 DIAGNOSIS — N6320 Unspecified lump in the left breast, unspecified quadrant: Secondary | ICD-10-CM

## 2023-10-11 ENCOUNTER — Ambulatory Visit
Admission: RE | Admit: 2023-10-11 | Discharge: 2023-10-11 | Disposition: A | Discharge status: Home / Self Care | Source: Ambulatory Visit | Attending: Obstetrics and Gynecology | Admitting: Obstetrics and Gynecology

## 2023-10-11 DIAGNOSIS — N6322 Unspecified lump in the left breast, upper inner quadrant: Secondary | ICD-10-CM | POA: Diagnosis not present

## 2023-10-11 DIAGNOSIS — C50212 Malignant neoplasm of upper-inner quadrant of left female breast: Secondary | ICD-10-CM | POA: Diagnosis not present

## 2023-10-11 DIAGNOSIS — N6320 Unspecified lump in the left breast, unspecified quadrant: Secondary | ICD-10-CM

## 2023-10-11 HISTORY — PX: BREAST BIOPSY: SHX20

## 2023-10-12 LAB — SURGICAL PATHOLOGY

## 2023-10-13 ENCOUNTER — Telehealth: Payer: Self-pay | Admitting: *Deleted

## 2023-10-13 NOTE — Telephone Encounter (Signed)
 LVM to patient in reference to upcoming [facility] appointment on 6/25 arrival time of 1215, left my contact to call back and emailed packet

## 2023-10-17 ENCOUNTER — Encounter: Payer: Self-pay | Admitting: *Deleted

## 2023-10-17 ENCOUNTER — Other Ambulatory Visit: Payer: Self-pay

## 2023-10-17 DIAGNOSIS — C50212 Malignant neoplasm of upper-inner quadrant of left female breast: Secondary | ICD-10-CM

## 2023-10-18 ENCOUNTER — Encounter: Payer: Self-pay | Admitting: General Practice

## 2023-10-18 ENCOUNTER — Encounter: Payer: Self-pay | Admitting: Genetic Counselor

## 2023-10-18 ENCOUNTER — Inpatient Hospital Stay

## 2023-10-18 ENCOUNTER — Encounter: Payer: Self-pay | Admitting: *Deleted

## 2023-10-18 ENCOUNTER — Encounter: Payer: Self-pay | Admitting: Hematology

## 2023-10-18 ENCOUNTER — Inpatient Hospital Stay: Attending: Hematology | Admitting: Hematology

## 2023-10-18 ENCOUNTER — Ambulatory Visit
Admission: RE | Admit: 2023-10-18 | Discharge: 2023-10-18 | Disposition: A | Discharge status: Home / Self Care | Source: Ambulatory Visit | Attending: Radiation Oncology | Admitting: Radiation Oncology

## 2023-10-18 ENCOUNTER — Inpatient Hospital Stay: Admitting: Genetic Counselor

## 2023-10-18 ENCOUNTER — Other Ambulatory Visit: Payer: Self-pay | Admitting: General Surgery

## 2023-10-18 VITALS — BP 112/64 | HR 76 | Temp 98.7°F | Resp 17 | Ht 66.0 in | Wt 101.8 lb

## 2023-10-18 DIAGNOSIS — C50212 Malignant neoplasm of upper-inner quadrant of left female breast: Secondary | ICD-10-CM

## 2023-10-18 DIAGNOSIS — Z808 Family history of malignant neoplasm of other organs or systems: Secondary | ICD-10-CM | POA: Insufficient documentation

## 2023-10-18 DIAGNOSIS — Z170 Estrogen receptor positive status [ER+]: Secondary | ICD-10-CM

## 2023-10-18 DIAGNOSIS — Z8049 Family history of malignant neoplasm of other genital organs: Secondary | ICD-10-CM | POA: Diagnosis not present

## 2023-10-18 DIAGNOSIS — Z806 Family history of leukemia: Secondary | ICD-10-CM | POA: Insufficient documentation

## 2023-10-18 DIAGNOSIS — Z801 Family history of malignant neoplasm of trachea, bronchus and lung: Secondary | ICD-10-CM | POA: Insufficient documentation

## 2023-10-18 DIAGNOSIS — Z8042 Family history of malignant neoplasm of prostate: Secondary | ICD-10-CM | POA: Insufficient documentation

## 2023-10-18 DIAGNOSIS — Z800 Family history of malignant neoplasm of digestive organs: Secondary | ICD-10-CM | POA: Insufficient documentation

## 2023-10-18 LAB — CBC WITH DIFFERENTIAL (CANCER CENTER ONLY)
Abs Immature Granulocytes: 0 10*3/uL (ref 0.00–0.07)
Basophils Absolute: 0 10*3/uL (ref 0.0–0.1)
Basophils Relative: 1 %
Eosinophils Absolute: 0 10*3/uL (ref 0.0–0.5)
Eosinophils Relative: 1 %
HCT: 37.7 % (ref 36.0–46.0)
Hemoglobin: 12.8 g/dL (ref 12.0–15.0)
Immature Granulocytes: 0 %
Lymphocytes Relative: 24 %
Lymphs Abs: 0.9 10*3/uL (ref 0.7–4.0)
MCH: 31.2 pg (ref 26.0–34.0)
MCHC: 34 g/dL (ref 30.0–36.0)
MCV: 92 fL (ref 80.0–100.0)
Monocytes Absolute: 0.3 10*3/uL (ref 0.1–1.0)
Monocytes Relative: 8 %
Neutro Abs: 2.6 10*3/uL (ref 1.7–7.7)
Neutrophils Relative %: 66 %
Platelet Count: 166 10*3/uL (ref 150–400)
RBC: 4.1 MIL/uL (ref 3.87–5.11)
RDW: 12.9 % (ref 11.5–15.5)
WBC Count: 3.9 10*3/uL — ABNORMAL LOW (ref 4.0–10.5)
nRBC: 0 % (ref 0.0–0.2)

## 2023-10-18 LAB — GENETIC SCREENING ORDER

## 2023-10-18 LAB — CMP (CANCER CENTER ONLY)
ALT: 11 U/L (ref 0–44)
AST: 12 U/L — ABNORMAL LOW (ref 15–41)
Albumin: 4.3 g/dL (ref 3.5–5.0)
Alkaline Phosphatase: 32 U/L — ABNORMAL LOW (ref 38–126)
Anion gap: 5 (ref 5–15)
BUN: 9 mg/dL (ref 6–20)
CO2: 28 mmol/L (ref 22–32)
Calcium: 9.2 mg/dL (ref 8.9–10.3)
Chloride: 106 mmol/L (ref 98–111)
Creatinine: 0.77 mg/dL (ref 0.44–1.00)
GFR, Estimated: >60 mL/min (ref >60)
Glucose, Bld: 121 mg/dL — ABNORMAL HIGH (ref 70–99)
Potassium: 3.7 mmol/L (ref 3.5–5.1)
Sodium: 139 mmol/L (ref 135–145)
Total Bilirubin: 0.4 mg/dL (ref 0.0–1.2)
Total Protein: 6.7 g/dL (ref 6.5–8.1)
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
## 2023-10-18 NOTE — Progress Notes (Signed)
 [facility] Spiritual Care Note  Met with Kaniesha Bobbetta) and her husband Onesimo in [facility] to introduce Support Center team/resources.  she completed SDOH screening; results follow below.    SDOH Screenings   Food Insecurity: No Food Insecurity (10/18/2023)  Housing: Low Risk  (10/18/2023)  Transportation Needs: No Transportation Needs (10/18/2023)  Utilities: Not At Risk (10/18/2023)  Depression (PHQ2-9): Low Risk  (10/18/2023)  Tobacco Use: Low Risk  (10/18/2023)   Chaplain and patient discussed common feelings and emotions when being diagnosed with cancer, and the importance of support during treatment.  Chaplain informed patient of the support team and support services at [facility].  Chaplain provided contact information and encouraged patient to call with any questions or concerns.  Nathanel and [place] have three boys (14, 27, 21), all of whom are currently home this summer. We talked about how to share the news of Kathy's diagnosis and treatment plan with them. Nathanel also plans to share information with her four siblings before a big family vacation in [place] in two weeks.  Follow up needed: No. Nathanel prefers to reach out as needs arise.   [address] [place], [place], [facility] Pager [phone] Voicemail [phone]

## 2023-10-18 NOTE — Progress Notes (Signed)
 REFERRING PROVIDER: Lanny Callander, MD [address] [place],  [place] [address]  PRIMARY PROVIDER:  Pcp, No  PRIMARY REASON FOR VISIT:  1. Family history of pancreatic cancer   2. Family history of prostate cancer   3. Family history of uterine cancer   4. Family history of brain cancer   5. Malignant neoplasm of upper-inner quadrant of left breast in female, estrogen receptor positive (HCC)      HISTORY OF PRESENT ILLNESS:   Sheryl [name], a 48 y.o. female, was seen for a [place] cancer genetics consultation at the request of Dr. Lanny due to a personal and family history of cancer.  Sheryl [name] presents to clinic today to discuss the possibility of a hereditary predisposition to cancer, genetic testing, and to further clarify her future cancer risks, as well as potential cancer risks for family members.   In June 2025, at the age of 18, Sheryl [name] was diagnosed with breast cancer    CANCER HISTORY:  Oncology History  Malignant neoplasm of upper-inner quadrant of left breast in female, estrogen receptor positive (HCC)  10/11/2023 Cancer Staging   Staging form: Breast, AJCC 8th Edition - Clinical stage from 10/11/2023: Stage IA (cT1b, cN0, cM0, G2, ER+, PR+, HER2-) - Signed by Lanny Callander, MD on 10/18/2023 Stage prefix: Initial diagnosis Histologic grading system: 3 grade system   10/17/2023 Initial Diagnosis   Malignant neoplasm of upper-inner quadrant of left breast in female, estrogen receptor positive (HCC)      RISK FACTORS:  Menarche was at age 75.  First live birth at age 29.  OCP use for approximately 4 years.  Menopausal status: perimenopausal.  HRT use: 0 years. Colonoscopy: yes; normal. Mammogram within the last year: yes. Number of breast biopsies: 1.  Past Medical History:  Diagnosis Date   Family history of brain cancer    Family history of pancreatic cancer    Family history of prostate cancer    Family history of uterine cancer     Past Surgical  History:  Procedure Laterality Date   BREAST BIOPSY Left 10/11/2023   US  LT BREAST BX W LOC DEV 1ST LESION IMG BX SPEC US  GUIDE 10/11/2023 [facility]   MENISCUS REPAIR Right     Social History   Socioeconomic History   Marital status: Married    Spouse name: Not on file   Number of children: 3   Years of education: Not on file   Highest education level: Not on file  Occupational History   Not on file  Tobacco Use   Smoking status: Never   Smokeless tobacco: Never  Substance and Sexual Activity   Alcohol use: Yes    Comment: occasional   Drug use: Never   Sexual activity: Not on file  Other Topics Concern   Not on file  Social History Narrative   Not on file   Social Drivers of Health   Financial Resource Strain: Not on file  Food Insecurity: Not on file  Transportation Needs: Not on file  Physical Activity: Not on file  Stress: Not on file  Social Connections: Not on file     FAMILY HISTORY:  We obtained a detailed, 4-generation family history.  Significant diagnoses are listed below: Family History  Problem Relation Age of Onset   Lung cancer Father    Prostate cancer Father 12   Brain cancer Paternal Uncle    Uterine cancer Maternal Grandmother        dx <  50   Pancreatic cancer Maternal Grandfather    Acute myelogenous leukemia Paternal Cousin 17       paternal first cousin's son     The patient has three boys who are cancer free.  She has two brothers and tow sister who are cancer free.  Her father is deceased and her mother is living.  The patient's father had prostate and lung caner.  He had a brother and three sisters.  The brother had a brain tumor and his grandson had AML.  There is no other cancer on this side of the family.  The patient's mother is living.  Her siblings are cancer free.  Her mother had uterine cancer under 50 and her father had pancreatic cancer.  Sheryl [name] is unaware of previous family history of genetic testing for  hereditary cancer risks. There is no reported Ashkenazi Jewish ancestry. There is no known consanguinity.  GENETIC COUNSELING ASSESSMENT: Sheryl [name] is a 48 y.o. female with a personal and family history of cancer which is somewhat suggestive of a hereditary cancer syndrome and predisposition to cancer given the combination of cancer and the patient's young age of onset. We, therefore, discussed and recommended the following at today's visit.   DISCUSSION: We discussed that, in general, most cancer is not inherited in families, but instead is sporadic or familial. Sporadic cancers occur by chance and typically happen at older ages (>50 years) as this type of cancer is caused by genetic changes acquired during an individual's lifetime. Some families have more cancers than would be expected by chance; however, the ages or types of cancer are not consistent with a known genetic mutation or known genetic mutations have been ruled out. This type of familial cancer is thought to be due to a combination of multiple genetic, environmental, hormonal, and lifestyle factors. While this combination of factors likely increases the risk of cancer, the exact source of this risk is not currently identifiable or testable.  We discussed that 5 - 10% of breast cancer is hereditary, with most cases associated with BRCA mutations.  There are other genes that can be associated with hereditary breast cancer syndromes.  These include ATM, CHEK2 and PALB2.  We discussed that testing is beneficial for several reasons including knowing [name] to follow individuals after completing their treatment, identifying whether potential treatment options such as PARP inhibitors would be beneficial, and understand if other family members could be at risk for cancer and allow them to undergo genetic testing.   We reviewed the characteristics, features and inheritance patterns of hereditary cancer syndromes. We also discussed genetic testing,  including the appropriate family members to test, the process of testing, insurance coverage and turn-around-time for results. We discussed the implications of a negative, positive and/or variant of uncertain significant result. In order to get genetic test results in a timely manner so that Sheryl [name] can use these genetic test results for surgical decisions, we recommended Sheryl [name] pursue genetic testing for the STAT panel. Once complete, we recommend Sheryl [name] pursue reflex genetic testing to the CancerNext=Expanded+RNAinsight gene panel.   The CancerNext-Expanded gene panel offered by [facility] and includes sequencing, rearrangement, and RNA analysis for the following 77 genes: AIP, ALK, APC, ATM, BAP1, BARD1, BMPR1A, BRCA1, BRCA2, BRIP1, CDC73, CDH1, CDK4, CDKN1B, CDKN2A, CEBPA, CHEK2, CTNNA1, DDX41, DICER1, ETV6, FH, FLCN, GATA2, LZTR1, MAX, MBD4, MEN1, MET, MLH1, MSH2, MSH3, MSH6, MUTYH, NF1, NF2, NTHL1, PALB2, PHOX2B, PMS2, POT1, PRKAR1A, PTCH1, PTEN, RAD51C, RAD51D, RB1, RET,  RPS20, RUNX1, SDHA, SDHAF2, SDHB, SDHC, SDHD, SMAD4, SMARCA4, SMARCB1, SMARCE1, STK11, SUFU, TMEM127, TP53, TSC1, TSC2, VHL, and WT1 (sequencing and deletion/duplication); AXIN2, CTNNA1, DDX41, EGFR, HOXB13, KIT, MBD4, MITF, MSH3, PDGFRA, POLD1 and POLE (sequencing only); EPCAM and GREM1 (deletion/duplication only). RNA data is routinely analyzed for use in variant interpretation for all genes.   Based on Sheryl [name]'s personal and family history of cancer, she meets medical criteria for genetic testing. Despite that she meets criteria, she may still have an out of pocket cost. We discussed that if her out of pocket cost for testing is over $100, the laboratory will call and confirm whether she wants to proceed with testing.  If the out of pocket cost of testing is less than $100 she will be billed by the genetic testing laboratory.   We discussed that some people do not want to undergo genetic testing due to fear of  genetic discrimination.  The Genetic Information Nondiscrimination Act (GINA) was signed into federal law in 2008. GINA prohibits health insurers and most employers from discriminating against individuals based on genetic information (including the results of genetic tests and family history information). According to GINA, health insurance companies cannot consider genetic information to be a preexisting condition, nor can they use it to make decisions regarding coverage or rates. GINA also makes it illegal for most employers to use genetic information in making decisions about hiring, firing, promotion, or terms of employment. It is important to note that GINA does not offer protections for life insurance, disability insurance, or long-term care insurance. GINA does not apply to those in the [employer], those who work for companies with less than 15 employees, and new life insurance or long-term disability insurance policies.  Health status due to a cancer diagnosis is not protected under GINA. More information about GINA can be found by visiting [email or web address].  PLAN: After considering the risks, benefits, and limitations, Sheryl [name] provided informed consent to pursue genetic testing and the blood sample was sent to [facility] for analysis of the CancerNext-Expanded+RNAinsight. Results should be available within approximately 2-3 weeks' time, at which point they will be disclosed by telephone to Sheryl [name], as will any additional recommendations warranted by these results. Sheryl [name] will receive a summary of her genetic counseling visit and a copy of her results once available. This information will also be available in Epic.   Lastly, we encouraged Sheryl [name] to remain in contact with cancer genetics annually so that we can continuously update the family history and inform her of any changes in cancer genetics and testing that may be of benefit for this family.   Sheryl [name]  questions were answered to her satisfaction today. Our contact information was provided should additional questions or concerns arise. Thank you for the referral and allowing us  to share in the care of your patient.   Johnel Yielding P. Perri, MS, CGC Licensed, Patent attorney [email or web address] phone: [phone]  30 minutes were spent on the date of the encounter in service to the patient including preparation, face-to-face consultation, documentation and care coordination.  The patient brought her husband. Drs. Lanny Stalls, and/or Gudena were available for questions, if needed..    _______________________________________________________________________ For Office Staff:  Number of people involved in session: 2 Was an Intern/ student involved with case: no

## 2023-10-18 NOTE — Research (Signed)
 [employer] 2021-05 - Specimen Collection Study to Evaluate Biomarkers in Subjects with Cancer   Patient Sheryl [name] was identified by Dr.Feng as a potential candidate for the above listed study.  This Clinical Research Nurse met with Sheryl [name], [id number], on 10/18/23 in a manner and location that ensures patient privacy to discuss participation in the above listed research study.  Patient is Accompanied by husband.  A copy of the informed consent document with embedded HIPAA language was provided to the patient.  Patient reads, speaks, and understands [place].   Patient was provided with the business card of this Nurse and encouraged to contact the research team with any questions.  Approximately 10 minutes were spent with the patient reviewing the informed consent documents.  Patient was provided the option of taking informed consent documents home to review and was encouraged to review at their convenience with their support network, including other care providers. Patient took the consent documents home to review. Patient asked if this would be billed to her insurance. Informed pt that this is for research purposes and it will not be billed to her insurance. Also informed pt that she will need to consent and have her blood drawn before her surgery or before any treatment. The pt was told that her participation is optional for this study. Pt made aware that she would get a $50 gift card if she participates in the study with a successful blood sample. In agreement with patient, we will follow up with patient over the phone on Monday October 23, 2023 sometime in the afternoon regarding consideration for enrollment in the above clinical trial. All pt's questions were answered and pt did not have any more questions. Pt was thanked for her time and interest in the study.   [name], RN, BSN Clinical Research Nurse [phone] 10/18/2023

## 2023-10-18 NOTE — Progress Notes (Signed)
 Radiation Oncology         [phone] ________________________________  Name: Sheryl [name]        MRN: [id number]  Date of Service: 10/18/2023 DOB: 1975-10-29  CC:Pcp, No  Ebbie Cough, MD     REFERRING PHYSICIAN: Ebbie Cough, MD   DIAGNOSIS: The encounter diagnosis was Malignant neoplasm of upper-inner quadrant of left breast in female, estrogen receptor positive (HCC).   HISTORY OF PRESENT ILLNESS: Sheryl [name] is a 48 y.o. female seen in the multidisciplinary breast clinic for a new diagnosis of left breast cancer. The patient presented for screening mammogram which identified a possible left breast asymmetry.  Further diagnostic workup on 10/09/2023 showed a persistent area in the left breast over the posterior upper inner quadrant felt to be a possible spiculated mass.  By ultrasound this was confirmed as a mass in the 1 o'clock position measuring up to 1 cm.  Her left axilla was negative for adenopathy. 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  Her cancer was ER positive, PR positive, HER2 negative with a Ki-67 of 1%.  She is seen today to discuss treatment recommendations of her cancer.  PREVIOUS RADIATION THERAPY: No   PAST MEDICAL HISTORY: No past medical history on file.     PAST SURGICAL HISTORY: Past Surgical History:  Procedure Laterality Date   BREAST BIOPSY Left 10/11/2023   US  LT BREAST BX W LOC DEV 1ST LESION IMG BX SPEC US  GUIDE 10/11/2023 [facility] MAMMOGRAPHY     FAMILY HISTORY:  Family History  Problem Relation Age of Onset   Lung cancer Father    Prostate cancer Father    Brain cancer Paternal Uncle      SOCIAL HISTORY:   The patient is married and lives in [place]. 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 She is accompanied by her husband. They have two sons in college and their youngest son will be a Printmaker in high school at [employer].     ALLERGIES: Patient has no known allergies.   MEDICATIONS:  No current outpatient medications on file.   No current facility-administered medications for this visit.     REVIEW OF SYSTEMS: On review of systems, the patient reports that she is doing pretty well overall. She is glad to hear the results are favorable, but also still processing the new information. No breast specific complaints are verbalized.      PHYSICAL EXAM:  Wt Readings from Last 3 Encounters:  10/18/23 101 lb 12.8 oz (46.2 kg)  11/08/20 100 lb (45.4 kg)   Temp Readings from Last 3 Encounters:  10/18/23 98.7 F (37.1 C) (Temporal)  11/08/20 98.4 F (36.9 C) (Oral)   BP Readings from Last 3 Encounters:  10/18/23 112/64  11/08/20 99/85   Pulse Readings from Last 3 Encounters:  10/18/23 76  11/08/20 70    In general this is a well appearing caucasian female in no acute distress. She's alert and oriented x4 and appropriate throughout the examination. Cardiopulmonary assessment is negative for acute distress and she exhibits normal effort. Bilateral breast exam is deferred.    ECOG = 1  0 - Asymptomatic (Fully active, able to carry on all predisease activities without restriction)  1 - Symptomatic but completely ambulatory (Restricted in physically strenuous activity but ambulatory and able to carry out work of a light or sedentary nature. For example, light housework, office work)  2 - Symptomatic, <  50% in bed during the day (Ambulatory and capable of all self care but unable to carry out any work activities. Up and about more than 50% of waking hours)  3 - Symptomatic, >50% in bed, but not bedbound (Capable of only limited self-care, confined to bed or chair 50% or more of waking hours)  4 - Bedbound (Completely disabled. Cannot carry on any self-care. Totally confined to bed or chair)  5 - Death   Sheryl MM, Creech RH, Tormey DC, et al. [phone]). Toxicity and response criteria of the [facility]. Am. DOROTHA [name]. Oncol. 5 (6): 649-55    LABORATORY DATA:  Lab Results  Component Value Date   WBC 3.9 (L) 10/18/2023   HGB 12.8 10/18/2023   HCT 37.7 10/18/2023   MCV 92.0 10/18/2023   PLT 166 10/18/2023   Lab Results  Component Value Date   NA 139 10/18/2023   K 3.7 10/18/2023   CL 106 10/18/2023   CO2 28 10/18/2023   Lab Results  Component Value Date   ALT 11 10/18/2023   AST 12 (L) 10/18/2023   ALKPHOS 32 (L) 10/18/2023   BILITOT 0.4 10/18/2023      RADIOGRAPHY: US  LT BREAST BX W LOC DEV 1ST LESION IMG BX SPEC US  GUIDE Addendum Date: 10/18/2023 ADDENDUM REPORT: 10/18/2023 09:06 ADDENDUM: Pathology revealed GRADE II INVASIVE DUCTAL CARCINOMA, DUCTAL CARCINOMA IN SITU, SOLID, INTERMEDIATE NUCLEAR GRADE II, WITHOUT NECROSIS, CALCIFICATION: NOT IDENTIFIED, OTHER FINDINGS: STRIATED MUSCLE PRESENT, WITHOUT DEFINITE TUMOR INVASION of the LEFT breast, 11 o'clock, (ribbon clip). This was found to be concordant by Dr. Craig Farr. Pathology results were discussed with the patient by telephone. The patient reported doing well after the biopsy with moderate tenderness at the site. Post biopsy instructions and care were reviewed and questions were answered. The patient was encouraged to call [facility] for any additional concerns. My direct phone number was provided. The patient was referred to [facility] on October 17, 2024. Pathology results reported by Hendricks Benders, RN on 10/12/2023. Electronically Signed   By: Craig Farr M.D.   On: 10/18/2023 09:06   Result Date: 10/18/2023 CLINICAL DATA:  Left breast mass for biopsy EXAM: ULTRASOUND GUIDED LEFT BREAST CORE NEEDLE BIOPSY COMPARISON:  Previous exam(s). PROCEDURE: I met with the patient and we discussed the procedure of ultrasound-guided biopsy, including benefits and alternatives. We discussed the high  likelihood of a successful procedure. We discussed the risks of the procedure, including infection, bleeding, tissue injury, clip migration, and inadequate sampling. Informed written consent was given. The usual time-out protocol was performed immediately prior to the procedure. Lesion quadrant: Left breast 11 o'clock Using sterile technique and 1% Lidocaine as local anesthetic, under direct ultrasound visualization, a 12 gauge spring-loaded device was used to perform biopsy of mass at left breast 11 o'clock using a lateral approach. At the conclusion of the procedure ribbon shaped tissue marker clip was deployed into the biopsy cavity. Follow up 2 view mammogram was performed and dictated separately. IMPRESSION: Ultrasound guided biopsy of left breast.  No apparent complications. Electronically Signed: By: Craig Farr M.D. On: 10/11/2023 11:57   MM CLIP PLACEMENT LEFT Result Date: 10/11/2023 CLINICAL DATA:  Status post ultrasound-guided core biopsy of left breast mass EXAM: 3D DIAGNOSTIC LEFT MAMMOGRAM POST ULTRASOUND BIOPSY COMPARISON:  Previous exam(s). ACR Breast Density Category d: The breasts are extremely dense, which lowers the sensitivity of  mammography. FINDINGS: 3D Mammographic images were obtained following ultrasound guided biopsy of mass at left breast 11 o'clock. The ribbon biopsy marking clip is in expected position at the site of biopsy. IMPRESSION: Appropriate positioning of the ribbon shaped biopsy marking clip at the site of biopsy in the expected location of concern. Final Assessment: Post Procedure Mammograms for Marker Placement Electronically Signed   By: Craig Farr M.D.   On: 10/11/2023 12:19   MM 3D DIAGNOSTIC MAMMOGRAM UNILATERAL LEFT BREAST Result Date: 10/09/2023 CLINICAL DATA:  Recall from screening to evaluate a possible left breast asymmetry. EXAM: DIGITAL DIAGNOSTIC UNILATERAL LEFT MAMMOGRAM WITH TOMOSYNTHESIS AND CAD; ULTRASOUND LEFT BREAST LIMITED TECHNIQUE: Left digital  diagnostic mammography and breast tomosynthesis was performed. The images were evaluated with computer-aided detection. ; Targeted ultrasound examination of the left breast was performed. COMPARISON:  Previous exam(s). ACR Breast Density Category c: The breasts are heterogeneously dense, which may obscure small masses. FINDINGS: Additional views of the left breast were obtained. There is a persistent irregular/spiculated mass over the posterior upper left breast difficult to localize on the CC image. This is likely within the upper central breast. Targeted ultrasound is performed, showing a hypoechoic mass with irregular shape and borders over the 11 o'clock position of the left breast 6 cm from the nipple measuring 0.6 x 1 x 1 cm correlating to the mammographic finding. Ultrasound the left axilla is normal. IMPRESSION: Suspicious 1 cm mass over the 11 o'clock position of the left breast 6 cm from the nipple. Normal left axillary lymph nodes. RECOMMENDATION: Recommend ultrasound-guided core needle biopsy for further evaluation. I have discussed the findings and recommendations with the patient. If applicable, a reminder letter will be sent to the patient regarding the next appointment. BI-RADS CATEGORY  5: Highly suggestive of malignancy. Biopsy scheduling will be facilitated by the ultrasound technologist prior to patient's departure. Electronically Signed   By: Toribio Agreste M.D.   On: 10/09/2023 14:58   US  LIMITED ULTRASOUND INCLUDING AXILLA LEFT BREAST  Result Date: 10/09/2023 CLINICAL DATA:  Recall from screening to evaluate a possible left breast asymmetry. EXAM: DIGITAL DIAGNOSTIC UNILATERAL LEFT MAMMOGRAM WITH TOMOSYNTHESIS AND CAD; ULTRASOUND LEFT BREAST LIMITED TECHNIQUE: Left digital diagnostic mammography and breast tomosynthesis was performed. The images were evaluated with computer-aided detection. ; Targeted ultrasound examination of the left breast was performed. COMPARISON:  Previous exam(s). ACR  Breast Density Category c: The breasts are heterogeneously dense, which may obscure small masses. FINDINGS: Additional views of the left breast were obtained. There is a persistent irregular/spiculated mass over the posterior upper left breast difficult to localize on the CC image. This is likely within the upper central breast. Targeted ultrasound is performed, showing a hypoechoic mass with irregular shape and borders over the 11 o'clock position of the left breast 6 cm from the nipple measuring 0.6 x 1 x 1 cm correlating to the mammographic finding. Ultrasound the left axilla is normal. IMPRESSION: Suspicious 1 cm mass over the 11 o'clock position of the left breast 6 cm from the nipple. Normal left axillary lymph nodes. RECOMMENDATION: Recommend ultrasound-guided core needle biopsy for further evaluation. I have discussed the findings and recommendations with the patient. If applicable, a reminder letter will be sent to the patient regarding the next appointment. BI-RADS CATEGORY  5: Highly suggestive of malignancy. Biopsy scheduling will be facilitated by the ultrasound technologist prior to patient's departure. Electronically Signed   By: Toribio Agreste M.D.   On: 10/09/2023 14:58  IMPRESSION/PLAN: 1. Stage IA, cT1bN0M0 grade 2, ER/PR positive invasive ductal carcinoma of the left breast. Dr. Dewey discusses the pathology findings and reviews the nature of early stage breast disease. The consensus from the breast conference includes breast conservation with lumpectomy with sentinel node biopsy. She would consider surgical intervention based on her genetic testing results as well. Depending on the size of the final tumor measurements rendered by pathology, the tumor may be tested for Oncotype Dx score to determine a role for systemic therapy. Provided that chemotherapy is not indicated, the patient's course would then be followed by external radiotherapy to the breast  to reduce risks of local  recurrence. Dr. Lanny anticipates adjuvant antiestrogen therapy to follow. We discussed the risks, benefits, short, and long term effects of radiotherapy, as well as the curative intent, and the patient is interested in proceeding. Dr. Dewey discusses the delivery and logistics of radiotherapy and anticipates a course of 4  or up to 6 1/2 weeks of radiotherapy. We will see her back a few weeks after surgery to discuss the simulation process and anticipate we starting radiotherapy about 4-6 weeks after surgery.    2. Possible genetic predisposition to malignancy. The patient is a candidate for genetic testing given her personal and family history. She will meet with our geneticist today in clinic. 3. Contraceptive Counseling. The patient's contraception is her husband's vasectomy procedure. No pregnancy testing is needed prior to radiotherapy.   In a visit lasting 60 minutes, greater than 50% of the time was spent face to face reviewing her case, as well as in preparation of, discussing, and coordinating the patient's care.  The above documentation reflects my direct findings during this shared patient visit. Please see the separate note by Dr. Dewey on this date for the remainder of the patient's plan of care.    Donald KYM Husband, [facility]    **Disclaimer: This note was dictated with voice recognition software. Similar sounding words can inadvertently be transcribed and this note may contain transcription errors which may not have been corrected upon publication of note.**

## 2023-10-19 ENCOUNTER — Encounter

## 2023-10-19 ENCOUNTER — Other Ambulatory Visit

## 2023-10-19 LAB — CANCER ANTIGEN 27.29: CA 27.29: <9 U/mL (ref 0.0–38.6)

## 2023-10-20 ENCOUNTER — Other Ambulatory Visit: Payer: Self-pay | Admitting: General Surgery

## 2023-10-20 DIAGNOSIS — Z170 Estrogen receptor positive status [ER+]: Secondary | ICD-10-CM

## 2023-10-23 ENCOUNTER — Telehealth: Payer: Self-pay | Admitting: *Deleted

## 2023-10-23 NOTE — Telephone Encounter (Signed)
 Followed up with pt this afternoon regarding her interest in the [employer] study. Pt stated she would like to participate in the study. Pt set up an appointment time with this RN for Tuesday July 8th, 2025 at 0930 for consent and blood draw. Pt is aware of date, time, and location for this appointment. All questions answered.

## 2023-10-24 DIAGNOSIS — C50919 Malignant neoplasm of unspecified site of unspecified female breast: Secondary | ICD-10-CM

## 2023-10-24 HISTORY — DX: Malignant neoplasm of unspecified site of unspecified female breast: C50.919

## 2023-10-25 ENCOUNTER — Encounter: Payer: Self-pay | Admitting: Hematology

## 2023-10-26 ENCOUNTER — Telehealth: Payer: Self-pay | Admitting: Genetic Counselor

## 2023-10-26 ENCOUNTER — Telehealth: Payer: Self-pay | Admitting: *Deleted

## 2023-10-26 ENCOUNTER — Encounter: Payer: Self-pay | Admitting: *Deleted

## 2023-10-26 NOTE — Telephone Encounter (Signed)
 Left message for a return phone call to follow up from [facility] 6/25 and assess navigation needs.

## 2023-10-26 NOTE — Telephone Encounter (Signed)
 Revealed negative genetic testing on the BRCAPlus panel.  The remaining part of the testing is pending.

## 2023-10-30 ENCOUNTER — Encounter: Payer: Self-pay | Admitting: Genetic Counselor

## 2023-10-30 ENCOUNTER — Telehealth: Payer: Self-pay | Admitting: Genetic Counselor

## 2023-10-30 ENCOUNTER — Ambulatory Visit: Payer: Self-pay | Admitting: Genetic Counselor

## 2023-10-30 DIAGNOSIS — Z1379 Encounter for other screening for genetic and chromosomal anomalies: Secondary | ICD-10-CM

## 2023-10-30 DIAGNOSIS — C50212 Malignant neoplasm of upper-inner quadrant of left female breast: Secondary | ICD-10-CM

## 2023-10-30 NOTE — Progress Notes (Signed)
 HPI:  Ms. Delosreyes was previously seen in the [facility] due to a personal and family history of cancer and concerns regarding a hereditary predisposition to cancer. Please refer to our prior cancer genetics clinic note for more information regarding our discussion, assessment and recommendations, at the time. Ms. Joerger recent genetic test results were disclosed to her, as were recommendations warranted by these results. These results and recommendations are discussed in more detail below.  CANCER HISTORY:  Oncology History  Malignant neoplasm of upper-inner quadrant of left breast in female, estrogen receptor positive (HCC)  10/11/2023 Cancer Staging   Staging form: Breast, AJCC 8th Edition - Clinical stage from 10/11/2023: Stage IA (cT1b, cN0, cM0, G2, ER+, PR+, HER2-) - Signed by Lanny Callander, MD on 10/18/2023 Stage prefix: Initial diagnosis Histologic grading system: 3 grade system   10/17/2023 Initial Diagnosis   Malignant neoplasm of upper-inner quadrant of left breast in female, estrogen receptor positive (HCC)   10/29/2023 Genetic Testing   Negative genetic testing on the CancerNext-Expanded+RNAinsight panel.  The report date is October 29, 2023.  The CancerNext-Expanded gene panel offered by [facility] and includes sequencing, rearrangement, and RNA analysis for the following 77 genes: AIP, ALK, APC, ATM, BAP1, BARD1, BMPR1A, BRCA1, BRCA2, BRIP1, CDC73, CDH1, CDK4, CDKN1B, CDKN2A, CEBPA, CHEK2, CTNNA1, DDX41, DICER1, ETV6, FH, FLCN, GATA2, LZTR1, MAX, MBD4, MEN1, MET, MLH1, MSH2, MSH3, MSH6, MUTYH, NF1, NF2, NTHL1, PALB2, PHOX2B, PMS2, POT1, PRKAR1A, PTCH1, PTEN, RAD51C, RAD51D, RB1, RET, RPS20, RUNX1, SDHA, SDHAF2, SDHB, SDHC, SDHD, SMAD4, SMARCA4, SMARCB1, SMARCE1, STK11, SUFU, TMEM127, TP53, TSC1, TSC2, VHL, and WT1 (sequencing and deletion/duplication); AXIN2, CTNNA1, DDX41, EGFR, HOXB13, KIT, MBD4, MITF, MSH3, PDGFRA, POLD1 and POLE (sequencing only); EPCAM and GREM1  (deletion/duplication only). RNA data is routinely analyzed for use in variant interpretation for all genes.      FAMILY HISTORY:  We obtained a detailed, 4-generation family history.  Significant diagnoses are listed below: Family History  Problem Relation Age of Onset   Lung cancer Father    Prostate cancer Father 37   Brain cancer Paternal Uncle    Uterine cancer Maternal Grandmother        dx < 50   Pancreatic cancer Maternal Grandfather    Acute myelogenous leukemia Paternal Cousin 2       paternal first cousin's son       The patient has three boys who are cancer free.  She has two brothers and tow sister who are cancer free.  Her father is deceased and her mother is living.   The patient's father had prostate and lung caner.  He had a brother and three sisters.  The brother had a brain tumor and his grandson had AML.  There is no other cancer on this side of the family.   The patient's mother is living.  Her siblings are cancer free.  Her mother had uterine cancer under 50 and her father had pancreatic cancer.   Ms. Tamayo is unaware of previous family history of genetic testing for hereditary cancer risks. There is no reported Ashkenazi Jewish ancestry. There is no known consanguinity.  GENETIC TEST RESULTS: Genetic testing reported out on October 29, 2023 through the CancerNext-Expanded+RNAinsight cancer panel found no pathogenic mutations. The CancerNext-Expanded gene panel offered by [facility] and includes sequencing, rearrangement, and RNA analysis for the following 77 genes: AIP, ALK, APC, ATM, BAP1, BARD1, BMPR1A, BRCA1, BRCA2, BRIP1, CDC73, CDH1, CDK4, CDKN1B, CDKN2A, CEBPA, CHEK2, CTNNA1, DDX41, DICER1, ETV6, FH,  FLCN, GATA2, LZTR1, MAX, MBD4, MEN1, MET, MLH1, MSH2, MSH3, MSH6, MUTYH, NF1, NF2, NTHL1, PALB2, PHOX2B, PMS2, POT1, PRKAR1A, PTCH1, PTEN, RAD51C, RAD51D, RB1, RET, RPS20, RUNX1, SDHA, SDHAF2, SDHB, SDHC, SDHD, SMAD4, SMARCA4, SMARCB1, SMARCE1, STK11, SUFU,  TMEM127, TP53, TSC1, TSC2, VHL, and WT1 (sequencing and deletion/duplication); AXIN2, CTNNA1, DDX41, EGFR, HOXB13, KIT, MBD4, MITF, MSH3, PDGFRA, POLD1 and POLE (sequencing only); EPCAM and GREM1 (deletion/duplication only). RNA data is routinely analyzed for use in variant interpretation for all genes. The test report has been scanned into EPIC and is located under the Molecular Pathology section of the Results Review tab.  A portion of the result report is included below for reference.     We discussed with Ms. Sisler that because current genetic testing is not perfect, it is possible there may be a gene mutation in one of these genes that current testing cannot detect, but that chance is small.  We also discussed, that there could be another gene that has not yet been discovered, or that we have not yet tested, that is responsible for the cancer diagnoses in the family. It is also possible there is a hereditary cause for the cancer in the family that Ms. Gutman did not inherit and therefore was not identified in her testing.  Therefore, it is important to remain in touch with cancer genetics in the future so that we can continue to offer Ms. Fakhouri the most up to date genetic testing.   ADDITIONAL GENETIC TESTING: We discussed with Ms. Flitton that her genetic testing was fairly extensive.  If there are genes identified to increase cancer risk that can be analyzed in the future, we would be happy to discuss and coordinate this testing at that time.    CANCER SCREENING RECOMMENDATIONS: Ms. Cuadra test result is considered negative (normal).  This means that we have not identified a hereditary cause for her personal and family history of cancer at this time. Most cancers happen by chance and this negative test suggests that her personal and family history of cancer may fall into this category.    Possible reasons for Ms. Nocito's negative genetic test include:  1. There may be a gene mutation in  one of these genes that current testing methods cannot detect but that chance is small.  2. There could be another gene that has not yet been discovered, or that we have not yet tested, that is responsible for the cancer diagnoses in the family.  3.  There may be no hereditary risk for cancer in the family. The cancers in Ms. Hinger and/or her family may be sporadic/familial or due to other genetic and environmental factors. 4. It is also possible there is a hereditary cause for the cancer in the family that Ms. Boyson did not inherit.  Therefore, it is recommended she continue to follow the cancer management and screening guidelines provided by her oncology and primary healthcare provider. An individual's cancer risk and medical management are not determined by genetic test results alone. Overall cancer risk assessment incorporates additional factors, including personal medical history, family history, and any available genetic information that may result in a personalized plan for cancer prevention and surveillance  RECOMMENDATIONS FOR FAMILY MEMBERS:   Since she did not inherit a identifiable mutation in a cancer predisposition gene included on this panel, her children could not have inherited a known mutation from her in one of these genes. Individuals in this family might be at some increased risk of developing cancer, over  the general population risk, simply due to the family history of cancer.  We recommended women in this family have a yearly mammogram beginning at age 31, or 82 years younger than the earliest onset of cancer, an annual clinical breast exam, and perform monthly breast self-exams. Women in this family should also have a gynecological exam as recommended by their primary provider. All family members should be referred for colonoscopy starting at age 52, or 27 years younger than the earliest onset of cancer.  FOLLOW-UP: Lastly, we discussed with Ms. Hampshire that cancer genetics is  a rapidly advancing field and it is possible that new genetic tests will be appropriate for her and/or her family members in the future. We encouraged her to remain in contact with cancer genetics on an annual basis so we can update her personal and family histories and let her know of advances in cancer genetics that may benefit this family.   Our contact number was provided. Ms. Gitto questions were answered to her satisfaction, and she knows she is welcome to call us  at anytime with additional questions or concerns.   Darice Monte, MS, [facility] Licensed, Certified Genetic Counselor [email or web address]

## 2023-10-30 NOTE — Telephone Encounter (Signed)
Revealed negative genetic testing.  Discussed that we do not know why she has breast cancer or why there is cancer in the family. It could be due to a different gene that we are not testing, or maybe our current technology may not be able to pick something up.  It will be important for her to keep in contact with genetics to keep up with whether additional testing may be needed. 

## 2023-10-31 ENCOUNTER — Encounter: Payer: Self-pay | Admitting: Rehabilitation

## 2023-10-31 ENCOUNTER — Inpatient Hospital Stay: Attending: Hematology | Admitting: *Deleted

## 2023-10-31 ENCOUNTER — Inpatient Hospital Stay

## 2023-10-31 ENCOUNTER — Ambulatory Visit: Attending: General Surgery | Admitting: Rehabilitation

## 2023-10-31 ENCOUNTER — Encounter: Payer: Self-pay | Admitting: *Deleted

## 2023-10-31 ENCOUNTER — Other Ambulatory Visit: Payer: Self-pay | Admitting: *Deleted

## 2023-10-31 ENCOUNTER — Other Ambulatory Visit: Payer: Self-pay

## 2023-10-31 DIAGNOSIS — Z170 Estrogen receptor positive status [ER+]: Secondary | ICD-10-CM | POA: Diagnosis not present

## 2023-10-31 DIAGNOSIS — C50212 Malignant neoplasm of upper-inner quadrant of left female breast: Secondary | ICD-10-CM

## 2023-10-31 DIAGNOSIS — R293 Abnormal posture: Secondary | ICD-10-CM | POA: Insufficient documentation

## 2023-10-31 LAB — CBC WITH DIFFERENTIAL (CANCER CENTER ONLY)
Abs Immature Granulocytes: 0.01 K/uL (ref 0.00–0.07)
Basophils Absolute: 0 K/uL (ref 0.0–0.1)
Basophils Relative: 1 %
Eosinophils Absolute: 0.1 K/uL (ref 0.0–0.5)
Eosinophils Relative: 1 %
HCT: 42.4 % (ref 36.0–46.0)
Hemoglobin: 14.1 g/dL (ref 12.0–15.0)
Immature Granulocytes: 0 %
Lymphocytes Relative: 28 %
Lymphs Abs: 1.1 K/uL (ref 0.7–4.0)
MCH: 30.7 pg (ref 26.0–34.0)
MCHC: 33.3 g/dL (ref 30.0–36.0)
MCV: 92.2 fL (ref 80.0–100.0)
Monocytes Absolute: 0.3 K/uL (ref 0.1–1.0)
Monocytes Relative: 8 %
Neutro Abs: 2.4 K/uL (ref 1.7–7.7)
Neutrophils Relative %: 62 %
Platelet Count: 193 K/uL (ref 150–400)
RBC: 4.6 MIL/uL (ref 3.87–5.11)
RDW: 12.9 % (ref 11.5–15.5)
WBC Count: 3.8 K/uL — ABNORMAL LOW (ref 4.0–10.5)
nRBC: 0 % (ref 0.0–0.2)

## 2023-10-31 LAB — CMP (CANCER CENTER ONLY)
ALT: 12 U/L (ref 0–44)
AST: 14 U/L — ABNORMAL LOW (ref 15–41)
Albumin: 4.4 g/dL (ref 3.5–5.0)
Alkaline Phosphatase: 35 U/L — ABNORMAL LOW (ref 38–126)
Anion gap: 4 — ABNORMAL LOW (ref 5–15)
BUN: 10 mg/dL (ref 6–20)
CO2: 31 mmol/L (ref 22–32)
Calcium: 9.7 mg/dL (ref 8.9–10.3)
Chloride: 105 mmol/L (ref 98–111)
Creatinine: 0.72 mg/dL (ref 0.44–1.00)
GFR, Estimated: >60 mL/min (ref >60)
Glucose, Bld: 79 mg/dL (ref 70–99)
Potassium: 4 mmol/L (ref 3.5–5.1)
Sodium: 140 mmol/L (ref 135–145)
Total Bilirubin: 0.4 mg/dL (ref 0.0–1.2)
Total Protein: 6.9 g/dL (ref 6.5–8.1)

## 2023-10-31 LAB — RESEARCH LABS

## 2023-10-31 NOTE — Research (Signed)
 [employer] 2021-05 - Specimen Collection Study to Evaluate Biomarkers in Subjects with Cancer    Patient Sheryl [name] was identified by Dr.Feng as a potential candidate for the above listed study.  This Clinical Research Nurse met with Sheryl [name], Sheryl [name] on 10/31/23 in a manner and location that ensures patient privacy to discuss participation in the above listed research study.  Patient is Unaccompanied.  Patient was previously provided with informed consent documents.  Patient confirmed they have read the informed consent documents.  As outlined in the informed consent form, this Nurse and [name] discussed the purpose of the research study, the investigational nature of the study, study procedures and requirements for study participation, potential risks and benefits of study participation, as well as alternatives to participation.  This study is not blinded or double-blinded. The patient understands participation is voluntary and they may withdraw from study participation at any time.  This study does not involve randomization.  This study does not involve an investigational drug or device. This study does not involve a placebo. Patient understands enrollment is pending full eligibility review.   Confidentiality and how the patient's information will be used as part of study participation were discussed.  Patient was informed there is reimbursement provided for their time and effort spent on trial participation.  The patient is encouraged to discuss research study participation with their insurance provider to determine what costs they may incur as part of study participation, including research related injury.    All questions were answered to patient's satisfaction.  The informed consent with embedded HIPAA language was reviewed page by page.  The patient's mental and emotional status is appropriate to provide informed consent, and the patient verbalizes an  understanding of study participation.  Patient has agreed to participate in the above listed research study and has voluntarily signed the informed consent version 08 May 2020 Revised 24 May 2021 with embedded HIPAA language, version 08 May 2020 Revised 24 May 2021 on 10/31/23 at 10:00AM.  The patient was provided with a copy of the signed informed consent form with embedded HIPAA language for their reference.  No study specific procedures were obtained prior to the signing of the informed consent document.  Approximately 10 minutes were spent with the patient reviewing the informed consent documents.  Patient was not requested to complete a Release of Information form.  All questions and concerns were addressed and pt did not have any additional questions.   Eligibility: Eligibility criteria reviewed with patient. This nurse/coordinator has reviewed this patient's inclusion and exclusion criteria and confirmed patient is eligible for study participation. Eligibility confirmed by treating investigator, who also agrees that patient should proceed with enrollment. Patient will continue with enrollment.  Data Collection: Patient was interviewed to collect the following information.  Medical History:  High Blood Pressure  No Coronary Artery Disease No Lupus    No Rheumatoid Arthritis  No Diabetes   No      Lynch Syndrome  No  Is the patient currently taking a magnesium supplement?   No  Does the patient have a personal history of cancer (greater than 5 years ago)?  No  Does the patient have a family history of cancer in 1st or 2nd degree relatives? Yes If yes, Relationship(s) and Cancer type(s)?  1st: dad- prostate & lung,  2nd degree: maternal grandma- uterine, paternal uncle brain tumor  Does the patient have history of alcohol consumption? No    Does the patient have  history of cigarette, cigar, pipe, or chewing tobacco use?  No   Blood Collection: Research blood obtained by fresh  venipuncture . Patient tolerated well without any adverse events.  Gift Card: $50 gift card given to patient for her participation in this study.    Patient was thanked for their participation in this study.    [name], RN, BSN Clinical Research Nurse [phone] 10/31/2023

## 2023-10-31 NOTE — Research (Signed)
 [employer] 2021-05 - Specimen Collection Study to Evaluate Biomarkers in Subjects with Cancer    This Coordinator has reviewed this patient's inclusion and exclusion criteria as a second review and confirms Sheryl [name] is eligible for study participation.  Patient may continue with enrollment.   Dustee Bottenfield, Ph.D. Clinical Research Coordinator [phone] 10/31/2023 10:06 AM

## 2023-10-31 NOTE — Therapy (Signed)
 OUTPATIENT PHYSICAL THERAPY BREAST CANCER BASELINE EVALUATION   Patient Name: Sheryl [name] MRN: [id number] DOB:17-Jun-1975, 48 y.o., female Today's Date: 10/31/2023  END OF SESSION:  PT End of Session - 10/31/23 1147     Visit Number 1    Number of Visits 2    Date for PT Re-Evaluation 12/12/23    Authorization Type none    PT Start Time 1105    PT Stop Time 1135    PT Time Calculation (min) 30 min    Activity Tolerance Patient tolerated treatment well    Behavior During Therapy WFL for tasks assessed/performed          Past Medical History:  Diagnosis Date   Family history of brain cancer    Family history of pancreatic cancer    Family history of prostate cancer    Family history of uterine cancer    Past Surgical History:  Procedure Laterality Date   BREAST BIOPSY Left 10/11/2023   US  LT BREAST BX W LOC DEV 1ST LESION IMG BX SPEC US  GUIDE 10/11/2023 [facility]   MENISCUS REPAIR Right    Patient Active Problem List   Diagnosis Date Noted   Genetic testing 10/30/2023   Family history of pancreatic cancer    Family history of prostate cancer    Family history of uterine cancer    Family history of brain cancer    Malignant neoplasm of upper-inner quadrant of left breast in female, estrogen receptor positive (HCC) 10/17/2023    REFERRING PROVIDER: [name], MD  REFERRING DIAG:  Diagnosis  C50.212,Z17.0 (ICD-10-CM) - Malignant neoplasm of upper-inner quadrant of left breast in female, estrogen receptor positive (HCC)   THERAPY DIAG:  Malignant neoplasm of upper-inner quadrant of left breast in female, estrogen receptor positive (HCC)  Abnormal posture  Rationale for Evaluation and Treatment: Rehabilitation  ONSET DATE: 10/09/23  SUBJECTIVE:                                                                                                                                                                                           SUBJECTIVE  STATEMENT: Patient reports she is here today to be seen by her medical team for her newly diagnosed left breast cancer.   PERTINENT HISTORY:  Patient was diagnosed on 10/09/23 with left grade 2 IDC with DCIS. It measures 1 cm and is located in the upper-inner quadrant. It is ER/PR positive, HER2 neg with a Ki67 of 1%. Pt will have a lumpectomy with SLNB 11/15/23.  PATIENT GOALS:   reduce lymphedema risk and learn post op HEP.   PAIN:  Are you having pain?  No  PRECAUTIONS: Active CA   RED FLAGS: None   HAND DOMINANCE: right  WEIGHT BEARING RESTRICTIONS: No  FALLS:  Has patient fallen in last 6 months? No  LIVING ENVIRONMENT: Patient lives with: husband, 3 kids   OCCUPATION: preschool teacher in [place]   LEISURE: play pickleball, yoga,   PRIOR LEVEL OF FUNCTION: Independent   OBJECTIVE: Note: Objective measures were completed at Evaluation unless otherwise noted.  COGNITION: Overall cognitive status: Within functional limits for tasks assessed    POSTURE:  Forward head and rounded shoulders posture  UPPER EXTREMITY AROM/PROM:  A/PROM RIGHT   eval   Shoulder extension 58  Shoulder flexion 165  Shoulder abduction 165  Shoulder internal rotation 70  Shoulder external rotation 110    (Blank rows = not tested)  A/PROM LEFT   eval  Shoulder extension 57  Shoulder flexion 150  Shoulder abduction 165  Shoulder internal rotation 70  Shoulder external rotation 95    (Blank rows = not tested)  CERVICAL AROM: All within normal limits:   UPPER EXTREMITY STRENGTH:  5/5 no pain  LYMPHEDEMA ASSESSMENTS (in cm):   LANDMARK RIGHT   eval  10 cm proximal to olecranon process 22  Olecranon process 21.1  10 cm proximal to ulnar styloid process 16.1  Just proximal to ulnar styloid process 15.6  Across hand at thumb web space 18.0  At base of 2nd digit 6.0  (Blank rows = not tested)  LANDMARK LEFT   eval  10 cm proximal to olecranon process 22.3  Olecranon  process 21.1  10 cm proximal to ulnar styloid process 15.6  Just proximal to ulnar styloid process 14.7  Across hand at thumb web space 17.6  At base of 2nd digit 5.7  (Blank rows = not tested)  L-DEX LYMPHEDEMA SCREENING: The patient was assessed using the L-Dex machine today to produce a lymphedema index baseline score. The patient will be reassessed on a regular basis (typically every 3 months) to obtain new L-Dex scores. If the score is > 6.5 points away from his/her baseline score indicating onset of subclinical lymphedema, it will be recommended to wear a compression garment for 4 weeks, 12 hours per day and then be reassessed. If the score continues to be > 6.5 points from baseline at reassessment, we will initiate lymphedema treatment. Assessing in this manner has a 95% rate of preventing clinically significant lymphedema.  QUICK DASH SURVEY: 0%  PATIENT EDUCATION:  Education details: Time spent educating patient on aspects of self-care to maximize post op recovery. Patient was educated on where and how to get a post op compression bra to use to reduce post op edema. Patient was also educated on the use of SOZO screenings and surveillance principles for early identification of lymphedema onset. She was instructed to use the post op pillow in the axilla for pressure and pain relief. Patient educated on lymphedema risk reduction and post op shoulder/posture HEP. Person educated: Patient Education method: Explanation, Demonstration, Handout Education comprehension: Patient verbalized understanding and returned demonstration  HOME EXERCISE PROGRAM: Patient was instructed today in a home exercise program today for post op shoulder range of motion. These included active assist shoulder flexion in sitting, scapular retraction, wall walking with shoulder abduction, and hands behind head external rotation.  She was encouraged to do these twice a day, holding 3 seconds and repeating 5 times when  permitted by her physician.   ASSESSMENT:  CLINICAL IMPRESSION: Pt will benefit from a post op  PT reassessment to determine needs and from L-Dex screens every 3 months for 2 years to detect subclinical lymphedema.  Pt will benefit from skilled therapeutic intervention to improve on the following deficits: Decreased knowledge of precautions, impaired UE functional use, pain, decreased ROM, postural dysfunction.   PT treatment/interventions: ADL/self-care home management, pt/family education, therapeutic exercise  REHAB POTENTIAL: Excellent  CLINICAL DECISION MAKING: Stable/uncomplicated  EVALUATION COMPLEXITY: Low   GOALS: Goals reviewed with patient? YES  LONG TERM GOALS: (STG=LTG)    Name Target Date Goal status  1 Pt will be able to verbalize understanding of pertinent lymphedema risk reduction practices relevant to her dx specifically related to skin care.  Baseline:  No knowledge 10/31/2023 Achieved at eval  2 Pt will be able to return demo and/or verbalize understanding of the post op HEP related to regaining shoulder ROM. Baseline:  No knowledge 10/31/2023 Achieved at eval  3 Pt will be able to verbalize understanding of the importance of viewing the post op After Breast CA Class video for further lymphedema risk reduction education and therapeutic exercise.  Baseline:  No knowledge 10/31/2023 Achieved at eval  4 Pt will demo she has regained full shoulder ROM and function post operatively compared to baselines.  Baseline: See objective measurements taken today. 12/09/23     PLAN:  PT FREQUENCY/DURATION: EVAL and 1 follow up appointment.   PLAN FOR NEXT SESSION: will reassess 3-4 weeks post op to determine needs.   Patient will follow up at outpatient cancer rehab 3-4 weeks following surgery.  If the patient requires physical therapy at that time, a specific plan will be dictated and sent to the referring physician for approval. The patient was educated today on appropriate  basic range of motion exercises to begin post operatively and the importance of viewing the After Breast Cancer class video following surgery.  Patient was educated today on lymphedema risk reduction practices as it pertains to recommendations that will benefit the patient immediately following surgery.  She verbalized good understanding.    Physical Therapy Information for After Breast Cancer Surgery/Treatment:  Lymphedema is a swelling condition that you may be at risk for in your arm if you have lymph nodes removed from the armpit area.  After a sentinel node biopsy, the risk is approximately 5-9% and is higher after an axillary node dissection.  There is treatment available for this condition and it is not life-threatening.  Contact your physician or physical therapist with concerns. You may begin the 4 shoulder/posture exercises (see additional sheet) when permitted by your physician (typically a week after surgery).  If you have drains, you may need to wait until those are removed before beginning range of motion exercises.  A general recommendation is to not lift your arms above shoulder height until drains are removed.  These exercises should be done to your tolerance and gently.  This is not a no pain/no gain type of recovery so listen to your body and stretch into the range of motion that you can tolerate, stopping if you have pain.  If you are having immediate reconstruction, ask your plastic surgeon about doing exercises as he or she may want you to wait. We encourage you to view the After Breast Cancer class video following surgery.  You will learn information related to lymphedema risk, prevention and treatment and additional exercises to regain mobility following surgery.   While undergoing any medical procedure or treatment, try to avoid blood pressure being taken or needle sticks from occurring  on the arm on the side of cancer.   This recommendation begins after surgery and continues for  the rest of your life.  This may help reduce your risk of getting lymphedema (swelling in your arm). An excellent resource for those seeking information on lymphedema is the National Lymphedema Network's web site. It can be accessed at www.lymphnet.org If you notice swelling in your hand, arm or breast at any time following surgery (even if it is many years from now), please contact your doctor or physical therapist to discuss this.  Lymphedema can be treated at any time but it is easier for you if it is treated early on.  If you feel like your shoulder motion is not returning to normal in a reasonable amount of time, please contact your surgeon or physical therapist.  [facility] [phone]. [address], [place] [address]  [facility] After Breast Cancer Class  After Breast Cancer Class is a specially designed exercise class video to assist you in a safe recover after having breast cancer surgery.  In this video you will learn how to get back to full function whether your drains were just removed or if you had surgery a month ago. The video can be viewed on this page: [email or web address].boyd-meyer.org/ or on YouTube here: [email or web address].[email or web address].  Class Goals  Understand specific stretches to improve the flexibility of you chest and shoulder. Learn ways to safely strengthen your upper body and improve your posture. Understand the warning signs of infection and why you may be at risk for an arm infection. Learn about Lymphedema and prevention.  ** You do not need to view this video until after surgery.  Drains should be removed to participate in the recommended exercises on the video.  Patient was instructed today in a home exercise program today for post op shoulder range of motion. These included active assist shoulder flexion in sitting, scapular retraction, wall walking with shoulder  abduction, and hands behind head external rotation.  She was encouraged to do these twice a day, holding 3 seconds and repeating 5 times when permitted by her physician.    Larue Saddie SAUNDERS, PT 10/31/2023, 11:48 AM

## 2023-11-01 LAB — CANCER ANTIGEN 27.29: CA 27.29: <9 U/mL (ref 0.0–38.6)

## 2023-11-02 DIAGNOSIS — D225 Melanocytic nevi of trunk: Secondary | ICD-10-CM | POA: Diagnosis not present

## 2023-11-02 DIAGNOSIS — D2261 Melanocytic nevi of right upper limb, including shoulder: Secondary | ICD-10-CM | POA: Diagnosis not present

## 2023-11-02 DIAGNOSIS — D2262 Melanocytic nevi of left upper limb, including shoulder: Secondary | ICD-10-CM | POA: Diagnosis not present

## 2023-11-02 DIAGNOSIS — L814 Other melanin hyperpigmentation: Secondary | ICD-10-CM | POA: Diagnosis not present

## 2023-11-08 ENCOUNTER — Encounter (HOSPITAL_BASED_OUTPATIENT_CLINIC_OR_DEPARTMENT_OTHER): Payer: Self-pay | Admitting: General Surgery

## 2023-11-14 ENCOUNTER — Ambulatory Visit (HOSPITAL_BASED_OUTPATIENT_CLINIC_OR_DEPARTMENT_OTHER)
Admission: RE | Admit: 2023-11-14 | Discharge: 2023-11-15 | Disposition: A | Discharge status: Home / Self Care | Attending: General Surgery | Admitting: General Surgery

## 2023-11-14 ENCOUNTER — Ambulatory Visit
Admission: RE | Admit: 2023-11-14 | Discharge: 2023-11-14 | Disposition: A | Discharge status: Home / Self Care | Source: Ambulatory Visit | Attending: General Surgery | Admitting: General Surgery

## 2023-11-14 DIAGNOSIS — Z1721 Progesterone receptor positive status: Secondary | ICD-10-CM | POA: Insufficient documentation

## 2023-11-14 DIAGNOSIS — Z1732 Human epidermal growth factor receptor 2 negative status: Secondary | ICD-10-CM | POA: Insufficient documentation

## 2023-11-14 DIAGNOSIS — D0512 Intraductal carcinoma in situ of left breast: Secondary | ICD-10-CM | POA: Diagnosis not present

## 2023-11-14 DIAGNOSIS — Z01818 Encounter for other preprocedural examination: Secondary | ICD-10-CM

## 2023-11-14 DIAGNOSIS — C50212 Malignant neoplasm of upper-inner quadrant of left female breast: Secondary | ICD-10-CM

## 2023-11-14 DIAGNOSIS — Z170 Estrogen receptor positive status [ER+]: Secondary | ICD-10-CM | POA: Insufficient documentation

## 2023-11-14 HISTORY — PX: BREAST BIOPSY: SHX20

## 2023-11-14 MED ORDER — ENSURE PRE-SURGERY PO LIQD: 296.0000 mL | Freq: Once | ORAL | Status: DC

## 2023-11-14 MED ORDER — CHLORHEXIDINE GLUCONATE CLOTH 2 % EX PADS: 6.0000 | MEDICATED_PAD | Freq: Once | CUTANEOUS | Status: DC

## 2023-11-14 NOTE — H&P (Signed)
 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 She has FH of brain tumor, prostate in dad. She has no prior breast history. She has no mass or dc. She had screening mm that shows a left breast asymmetry. US  shows a 1.1x1.1x0.6 cm mass. Axillary US  is negative. 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 She is here with her husband to discuss options Genetic testing negative  Review of Systems: A complete review of systems was obtained from the patient. I have reviewed this information and discussed as appropriate with the patient. See HPI as well for other ROS.  Review of Systems  All other systems reviewed and are negative.  Medical History: History reviewed. No pertinent past medical history.  Patient Active Problem List  Diagnosis  Malignant neoplasm of upper-inner quadrant of left breast in female, estrogen receptor positive (CMS/HHS-HCC)  Thrombocytopenia ()   History reviewed. No pertinent surgical history.   No Known Allergies  No current outpatient medications on file prior to visit.   History reviewed. No pertinent family history.   Social History   Tobacco Use  Smoking Status Never  Smokeless Tobacco Never  Marital status: Married  Tobacco Use  Smoking status: Never  Smokeless tobacco: Never   Objective:   Physical Exam Vitals reviewed.  Constitutional:  Appearance: Normal appearance.  Chest:  Breasts: Right: No inverted nipple, mass or nipple discharge.  Left: No inverted nipple, mass or nipple discharge.  Lymphadenopathy:  Upper Body:  Right upper body: No supraclavicular or axillary adenopathy.  Left upper body: No supraclavicular or axillary adenopathy.  Neurological:  Mental Status: She is alert.    Assessment and Plan:   Malignant neoplasm of upper-inner quadrant of left breast in female, estrogen receptor positive (CMS/HHS-HCC)   left breast seed guided lumpectomy/sn  biopsy  We discussed the staging and pathophysiology of breast cancer. We discussed all of the different options for treatment for breast cancer including surgery, chemotherapy, radiation therapy,and antiestrogen therapy.  We discussed a sentinel lymph node biopsy as she does not appear to having lymph node involvement right now. We discussed the performance of that with injection of magtrace, small risk of discoloration. We discussed that there is a chance of having a positive node with a sentinel lymph node biopsy and we will await the permanent pathology to make any other first further decisions in terms of her treatment. We discussed up to a 5% risk lifetime of chronic shoulder pain as well as lymphedema associated with a sentinel lymph node biopsy.  We discussed the options for treatment of the breast cancer which included lumpectomy versus a mastectomy. We discussed the performance of the lumpectomy with radioactive seed placement. We discussed a 5-10% chance of a positive margin requiring reexcision in the operating room. We also discussed that she will likely need radiation therapy if she undergoes lumpectomy. We discussed mastectomy and the postoperative care for that as well. Mastectomy can be followed by reconstruction. The decision for lumpectomy vs mastectomy has no impact on decision for chemotherapy. Most mastectomy patients will not need radiation therapy. We discussed that there is no difference in her survival whether she undergoes lumpectomy with radiation therapy or antiestrogen therapy versus a mastectomy. There is also no real difference between her recurrence in the breast.  We discussed the risks of operation including bleeding, infection, possible reoperation. She understands her further therapy will be based  on what her stages at the time of her operation.

## 2023-11-14 NOTE — Progress Notes (Signed)

## 2023-11-15 ENCOUNTER — Encounter (HOSPITAL_BASED_OUTPATIENT_CLINIC_OR_DEPARTMENT_OTHER): Payer: Self-pay | Admitting: General Surgery

## 2023-11-15 ENCOUNTER — Ambulatory Visit (HOSPITAL_BASED_OUTPATIENT_CLINIC_OR_DEPARTMENT_OTHER): Admitting: Anesthesiology

## 2023-11-15 ENCOUNTER — Other Ambulatory Visit: Payer: Self-pay

## 2023-11-15 ENCOUNTER — Encounter (HOSPITAL_BASED_OUTPATIENT_CLINIC_OR_DEPARTMENT_OTHER)
Admission: RE | Disposition: A | Discharge status: Home / Self Care | Payer: Self-pay | Source: Home / Self Care | Attending: General Surgery

## 2023-11-15 ENCOUNTER — Ambulatory Visit
Admission: RE | Admit: 2023-11-15 | Discharge: 2023-11-15 | Disposition: A | Discharge status: Home / Self Care | Source: Ambulatory Visit | Attending: General Surgery | Admitting: General Surgery

## 2023-11-15 DIAGNOSIS — C50912 Malignant neoplasm of unspecified site of left female breast: Secondary | ICD-10-CM | POA: Diagnosis not present

## 2023-11-15 DIAGNOSIS — D0512 Intraductal carcinoma in situ of left breast: Secondary | ICD-10-CM | POA: Diagnosis not present

## 2023-11-15 DIAGNOSIS — Z1721 Progesterone receptor positive status: Secondary | ICD-10-CM | POA: Diagnosis not present

## 2023-11-15 DIAGNOSIS — Z170 Estrogen receptor positive status [ER+]: Secondary | ICD-10-CM | POA: Diagnosis not present

## 2023-11-15 DIAGNOSIS — Z01818 Encounter for other preprocedural examination: Secondary | ICD-10-CM

## 2023-11-15 DIAGNOSIS — Z1732 Human epidermal growth factor receptor 2 negative status: Secondary | ICD-10-CM | POA: Diagnosis not present

## 2023-11-15 DIAGNOSIS — G8918 Other acute postprocedural pain: Secondary | ICD-10-CM | POA: Diagnosis not present

## 2023-11-15 DIAGNOSIS — C50212 Malignant neoplasm of upper-inner quadrant of left female breast: Secondary | ICD-10-CM | POA: Diagnosis not present

## 2023-11-15 HISTORY — PX: BREAST LUMPECTOMY WITH RADIOACTIVE SEED LOCALIZATION: SHX6424

## 2023-11-15 HISTORY — PX: AXILLARY SENTINEL NODE BIOPSY: SHX5738

## 2023-11-15 LAB — POCT PREGNANCY, URINE: Preg Test, Ur: NEGATIVE

## 2023-11-15 SURGERY — BREAST LUMPECTOMY WITH RADIOACTIVE SEED LOCALIZATION
Anesthesia: Regional | Site: Breast | Laterality: Left

## 2023-11-15 MED ORDER — MIDAZOLAM HCL 2 MG/2ML IJ SOLN
2.0000 mg | Freq: Once | INTRAMUSCULAR | Status: AC
  Administered 2023-11-15: 1 mg via INTRAVENOUS

## 2023-11-15 MED ORDER — TRAMADOL HCL 50 MG PO TABS: 50.0000 mg | ORAL_TABLET | Freq: Four times a day (QID) | ORAL | 0 refills | Status: DC | PRN

## 2023-11-15 MED ORDER — KETOROLAC TROMETHAMINE 30 MG/ML IJ SOLN: 15.0000 mg | Freq: Once | INTRAMUSCULAR | Status: DC | PRN

## 2023-11-15 MED ORDER — ONDANSETRON HCL 4 MG/2ML IJ SOLN: 4.0000 mg | Freq: Once | INTRAMUSCULAR | Status: DC | PRN

## 2023-11-15 MED ORDER — PROPOFOL 10 MG/ML IV BOLUS
INTRAVENOUS | Status: AC
  Filled 2023-11-15: qty 20

## 2023-11-15 MED ORDER — LIDOCAINE 2% (20 MG/ML) 5 ML SYRINGE
INTRAMUSCULAR | Status: DC | PRN
  Administered 2023-11-15: 40 mg via INTRAVENOUS

## 2023-11-15 MED ORDER — CEFAZOLIN SODIUM-DEXTROSE 2-4 GM/100ML-% IV SOLN
2.0000 g | INTRAVENOUS | Status: AC
  Administered 2023-11-15: 2 g via INTRAVENOUS

## 2023-11-15 MED ORDER — PROPOFOL 500 MG/50ML IV EMUL
INTRAVENOUS | Status: DC | PRN
  Administered 2023-11-15: 200 ug/kg/min via INTRAVENOUS

## 2023-11-15 MED ORDER — ONDANSETRON HCL 4 MG/2ML IJ SOLN
INTRAMUSCULAR | Status: AC
  Filled 2023-11-15: qty 2

## 2023-11-15 MED ORDER — ROPIVACAINE HCL 5 MG/ML IJ SOLN
INTRAMUSCULAR | Status: DC | PRN
  Administered 2023-11-15: 20 mL via PERINEURAL

## 2023-11-15 MED ORDER — PROPOFOL 10 MG/ML IV BOLUS
INTRAVENOUS | Status: DC | PRN
  Administered 2023-11-15: 30 mg via INTRAVENOUS
  Administered 2023-11-15: 150 mg via INTRAVENOUS

## 2023-11-15 MED ORDER — AMISULPRIDE (ANTIEMETIC) 5 MG/2ML IV SOLN: 10.0000 mg | Freq: Once | INTRAVENOUS | Status: DC | PRN

## 2023-11-15 MED ORDER — LACTATED RINGERS IV SOLN: INTRAVENOUS | Status: DC

## 2023-11-15 MED ORDER — ONDANSETRON HCL 4 MG/2ML IJ SOLN
INTRAMUSCULAR | Status: DC | PRN
  Administered 2023-11-15: 4 mg via INTRAVENOUS

## 2023-11-15 MED ORDER — MIDAZOLAM HCL 2 MG/2ML IJ SOLN
INTRAMUSCULAR | Status: AC
  Filled 2023-11-15: qty 2

## 2023-11-15 MED ORDER — OXYCODONE HCL 5 MG PO TABS
5.0000 mg | ORAL_TABLET | Freq: Once | ORAL | Status: AC | PRN
  Administered 2023-11-15: 5 mg via ORAL

## 2023-11-15 MED ORDER — FENTANYL CITRATE (PF) 100 MCG/2ML IJ SOLN
INTRAMUSCULAR | Status: DC | PRN
  Administered 2023-11-15 (×2): 50 ug via INTRAVENOUS

## 2023-11-15 MED ORDER — KETOROLAC TROMETHAMINE 15 MG/ML IJ SOLN
15.0000 mg | INTRAMUSCULAR | Status: AC
  Administered 2023-11-15: 15 mg via INTRAVENOUS

## 2023-11-15 MED ORDER — MEPERIDINE HCL 25 MG/ML IJ SOLN: 6.2500 mg | INTRAMUSCULAR | Status: DC | PRN

## 2023-11-15 MED ORDER — HYDROMORPHONE HCL 1 MG/ML IJ SOLN: 0.2500 mg | INTRAMUSCULAR | Status: DC | PRN

## 2023-11-15 MED ORDER — OXYCODONE HCL 5 MG PO TABS
ORAL_TABLET | ORAL | Status: AC
  Filled 2023-11-15: qty 1

## 2023-11-15 MED ORDER — 0.9 % SODIUM CHLORIDE (POUR BTL) OPTIME
TOPICAL | Status: DC | PRN
Start: 2023-11-15 — End: 2023-11-15
  Administered 2023-11-15: 1000 mL

## 2023-11-15 MED ORDER — FENTANYL CITRATE (PF) 100 MCG/2ML IJ SOLN
100.0000 ug | Freq: Once | INTRAMUSCULAR | Status: AC
  Administered 2023-11-15: 50 ug via INTRAVENOUS

## 2023-11-15 MED ORDER — LIDOCAINE 2% (20 MG/ML) 5 ML SYRINGE
INTRAMUSCULAR | Status: AC
  Filled 2023-11-15: qty 5

## 2023-11-15 MED ORDER — BUPIVACAINE LIPOSOME 1.3 % IJ SUSP
INTRAMUSCULAR | Status: DC | PRN
Start: 2023-11-15 — End: 2023-11-15
  Administered 2023-11-15: 10 mL via PERINEURAL

## 2023-11-15 MED ORDER — MAGTRACE LYMPHATIC TRACER
INTRAMUSCULAR | Status: DC | PRN
  Administered 2023-11-15: 1.5 mL via INTRAMUSCULAR

## 2023-11-15 MED ORDER — KETOROLAC TROMETHAMINE 15 MG/ML IJ SOLN
INTRAMUSCULAR | Status: AC
  Filled 2023-11-15: qty 1

## 2023-11-15 MED ORDER — DEXAMETHASONE SODIUM PHOSPHATE 10 MG/ML IJ SOLN
INTRAMUSCULAR | Status: AC
  Filled 2023-11-15: qty 1

## 2023-11-15 MED ORDER — ACETAMINOPHEN 500 MG PO TABS
ORAL_TABLET | ORAL | Status: AC
  Filled 2023-11-15: qty 2

## 2023-11-15 MED ORDER — CEFAZOLIN SODIUM-DEXTROSE 2-4 GM/100ML-% IV SOLN
INTRAVENOUS | Status: AC
  Filled 2023-11-15: qty 100

## 2023-11-15 MED ORDER — ACETAMINOPHEN 500 MG PO TABS: 1000.0000 mg | ORAL_TABLET | ORAL | Status: AC

## 2023-11-15 MED ORDER — FENTANYL CITRATE (PF) 100 MCG/2ML IJ SOLN
INTRAMUSCULAR | Status: AC
  Filled 2023-11-15: qty 2

## 2023-11-15 MED ORDER — DEXAMETHASONE SODIUM PHOSPHATE 4 MG/ML IJ SOLN
INTRAMUSCULAR | Status: DC | PRN
  Administered 2023-11-15: 10 mg via INTRAVENOUS

## 2023-11-15 MED ORDER — EPHEDRINE SULFATE (PRESSORS) 50 MG/ML IJ SOLN
INTRAMUSCULAR | Status: DC | PRN
  Administered 2023-11-15 (×3): 5 mg via INTRAVENOUS

## 2023-11-15 MED ORDER — ACETAMINOPHEN 500 MG PO TABS
1000.0000 mg | ORAL_TABLET | Freq: Once | ORAL | Status: AC
  Administered 2023-11-15: 1000 mg via ORAL

## 2023-11-15 MED ORDER — BUPIVACAINE HCL (PF) 0.25 % IJ SOLN
INTRAMUSCULAR | Status: AC
  Filled 2023-11-15: qty 30

## 2023-11-15 MED ORDER — OXYCODONE HCL 5 MG/5ML PO SOLN: 5.0000 mg | Freq: Once | ORAL | Status: AC | PRN

## 2023-11-15 SURGICAL SUPPLY — 47 items
BINDER BREAST LRG (GAUZE/BANDAGES/DRESSINGS) IMPLANT
BINDER BREAST MEDIUM (GAUZE/BANDAGES/DRESSINGS) IMPLANT
BINDER BREAST XLRG (GAUZE/BANDAGES/DRESSINGS) IMPLANT
BINDER BREAST XXLRG (GAUZE/BANDAGES/DRESSINGS) IMPLANT
BLADE SURG 15 STRL LF DISP TIS (BLADE) ×3 IMPLANT
CANISTER SUC SOCK COL 7IN (MISCELLANEOUS) IMPLANT
CANISTER SUCT 1200ML W/VALVE (MISCELLANEOUS) IMPLANT
CHLORAPREP W/TINT 26 (MISCELLANEOUS) ×3 IMPLANT
CLIP APPLIE 9.375 MED OPEN (MISCELLANEOUS) IMPLANT
CLIP TI WIDE RED SMALL 6 (CLIP) ×3 IMPLANT
COVER BACK TABLE 60X90IN (DRAPES) ×3 IMPLANT
COVER MAYO STAND STRL (DRAPES) ×3 IMPLANT
COVER PROBE CYLINDRICAL 5X96 (MISCELLANEOUS) ×3 IMPLANT
DERMABOND ADVANCED .7 DNX12 (GAUZE/BANDAGES/DRESSINGS) ×3 IMPLANT
DRAPE LAPAROSCOPIC ABDOMINAL (DRAPES) ×3 IMPLANT
DRAPE UTILITY XL STRL (DRAPES) ×3 IMPLANT
ELECT COATED BLADE 2.86 ST (ELECTRODE) ×3 IMPLANT
ELECTRODE REM PT RTRN 9FT ADLT (ELECTROSURGICAL) ×3 IMPLANT
GLOVE BIO SURGEON STRL SZ7 (GLOVE) ×6 IMPLANT
GLOVE BIOGEL PI IND STRL 7.5 (GLOVE) ×3 IMPLANT
GOWN STRL REUS W/ TWL LRG LVL3 (GOWN DISPOSABLE) ×6 IMPLANT
HEMOSTAT ARISTA ABSORB 3G PWDR (HEMOSTASIS) IMPLANT
KIT MARKER MARGIN INK (KITS) ×3 IMPLANT
NDL HYPO 25X1 1.5 SAFETY (NEEDLE) ×3 IMPLANT
NDL SAFETY ECLIPSE 18X1.5 (NEEDLE) IMPLANT
NEEDLE HYPO 25X1 1.5 SAFETY (NEEDLE) ×2 IMPLANT
NS IRRIG 1000ML POUR BTL (IV SOLUTION) IMPLANT
PACK BASIN DAY SURGERY FS (CUSTOM PROCEDURE TRAY) ×3 IMPLANT
PENCIL SMOKE EVACUATOR (MISCELLANEOUS) ×3 IMPLANT
RETRACTOR ONETRAX LX 90X20 (MISCELLANEOUS) IMPLANT
SLEEVE SCD COMPRESS KNEE MED (STOCKING) ×3 IMPLANT
SPIKE FLUID TRANSFER (MISCELLANEOUS) IMPLANT
SPONGE T-LAP 4X18 ~~LOC~~+RFID (SPONGE) ×3 IMPLANT
STRIP CLOSURE SKIN 1/2X4 (GAUZE/BANDAGES/DRESSINGS) ×3 IMPLANT
SUT ETHILON 2 0 FS 18 (SUTURE) IMPLANT
SUT MNCRL AB 4-0 PS2 18 (SUTURE) ×3 IMPLANT
SUT MON AB 5-0 PS2 18 (SUTURE) IMPLANT
SUT SILK 2 0 SH (SUTURE) IMPLANT
SUT VIC AB 2-0 SH 27XBRD (SUTURE) ×3 IMPLANT
SUT VIC AB 3-0 SH 27X BRD (SUTURE) ×3 IMPLANT
SUT VIC AB 5-0 PS2 18 (SUTURE) IMPLANT
SYR CONTROL 10ML LL (SYRINGE) ×3 IMPLANT
TOWEL GREEN STERILE FF (TOWEL DISPOSABLE) ×3 IMPLANT
TRACER MAGTRACE VIAL (MISCELLANEOUS) IMPLANT
TRAY FAXITRON CT DISP (TRAY / TRAY PROCEDURE) ×3 IMPLANT
TUBE CONNECTING 20X1/4 (TUBING) IMPLANT
YANKAUER SUCT BULB TIP NO VENT (SUCTIONS) IMPLANT

## 2023-11-15 NOTE — Interval H&P Note (Signed)
 History and Physical Interval Note:  11/15/2023 8:12 AM  Sheryl [name]  has presented today for surgery, with the diagnosis of LEFT BREAST CANCER.  The various methods of treatment have been discussed with the patient and family. After consideration of risks, benefits and other options for treatment, the patient has consented to  Procedure(s): BREAST LUMPECTOMY WITH RADIOACTIVE SEED LOCALIZATION (Left) BIOPSY, LYMPH NODE, SENTINEL, AXILLARY (Left) as a surgical intervention.  The patient's history has been reviewed, patient examined, no change in status, stable for surgery.  I have reviewed the patient's chart and labs.  Questions were answered to the patient's satisfaction.     [name]

## 2023-11-15 NOTE — Transfer of Care (Signed)
 Immediate Anesthesia Transfer of Care Note  Patient: Sheryl [name]  Procedure(s) Performed: BREAST LUMPECTOMY WITH RADIOACTIVE SEED LOCALIZATION (Left: Breast) BIOPSY, LYMPH NODE, SENTINEL, AXILLARY (Left)  Patient Location: PACU  Anesthesia Type:GA combined with regional for post-op pain  Level of Consciousness: awake, alert , and oriented  Airway & Oxygen Therapy: Patient Spontanous Breathing  Post-op Assessment: Report given to RN and Post -op Vital signs reviewed and stable  Post vital signs: Reviewed and stable  Last Vitals:  Vitals Value Taken Time  BP 101/47 11/15/23 09:46  Temp    Pulse 66 11/15/23 09:47  Resp 15 11/15/23 09:47  SpO2 100 % 11/15/23 09:47    Last Pain:  Vitals:   11/15/23 0647  TempSrc: Temporal  PainSc: 0-No pain      Patients Stated Pain Goal: 4 (11/15/23 0647)  Complications: No notable events documented.

## 2023-11-15 NOTE — Anesthesia Procedure Notes (Signed)
 Anesthesia Regional Block: Pectoralis block   Pre-Anesthetic Checklist: , timeout performed,  Correct Patient, Correct Site, Correct Laterality,  Correct Procedure, Correct Position, site marked,  Risks and benefits discussed,  Surgical consent,  Pre-op evaluation,  At surgeon's request and post-op pain management  Laterality: Left  Prep: Maximum Sterile Barrier Precautions used, chloraprep       Needles:  Injection technique: Single-shot  Needle Type: Echogenic Stimulator Needle     Needle Length: 9cm  Needle Gauge: 22     Additional Needles:   Procedures:,,,, ultrasound used (permanent image in chart),,    Narrative:  Start time: 11/15/2023 8:00 AM End time: 11/15/2023 8:10 AM Injection made incrementally with aspirations every 5 mL.  Performed by: Personally  Anesthesiologist: Merla Almarie HERO, DO  Additional Notes: Monitors applied. No increased pain on injection. No increased resistance to injection. Injection made in 5cc increments. Good needle visualization. Patient tolerated procedure well.

## 2023-11-15 NOTE — Discharge Instructions (Addendum)
 [facility] Office Phone Number [phone]  POST OP INSTRUCTIONS Take 400 mg of ibuprofen every 8 hours or 650 mg tylenol  every 6 hours for next 72 hours then as needed. Use ice several times daily also.  A prescription for pain medication may be given to you upon discharge.  Take your pain medication as prescribed, if needed.  If narcotic pain medicine is not needed, then you may take acetaminophen  (Tylenol ), naprosyn (Alleve) or ibuprofen (Advil) as needed. Take your usually prescribed medications unless otherwise directed If you need a refill on your pain medication, please contact your pharmacy.  They will contact our office to request authorization.  Prescriptions will not be filled after 5pm or on week-ends. You should eat very light the first 24 hours after surgery, such as soup, crackers, pudding, etc.  Resume your normal diet the day after surgery. Most patients will experience some swelling and bruising in the breast.  Ice packs and a good support bra will help.  Wear the breast binder provided or a sports bra for 72 hours day and night.  After that wear a sports bra during the day until you return to the office. Swelling and bruising can take several days to resolve.  It is common to experience some constipation if taking pain medication after surgery.  Increasing fluid intake and taking a stool softener will usually help or prevent this problem from occurring.  A mild laxative (Milk of Magnesia or Miralax) should be taken according to package directions if there are no bowel movements after 48 hours. I used skin glue on the incision, you may shower in 24 hours.  The glue will flake off over the next 2-3 weeks as will the steristrips.   Any sutures or staples will be removed at the office during your follow-up visit. ACTIVITIES:  You may resume regular daily activities (gradually increasing) beginning the next day.  Wearing a good support bra or sports bra minimizes pain and  swelling.  You may have sexual intercourse when it is comfortable. You may drive when you no longer are taking prescription pain medication, you can comfortably wear a seatbelt, and you can safely maneuver your car and apply brakes. RETURN TO WORK:  ______________________________________________________________________________________ Sheryl [name] should see your doctor in the office for a follow-up appointment approximately two to three weeks after your surgery.  Your doctor's nurse will typically make your follow-up appointment when she calls you with your pathology report.  Expect your pathology report 3-4 business days after your surgery.  You may call to check if you do not hear from us  after three days.  WHEN TO CALL DR WAKEFIELD: Fever over 101.0 Nausea and/or vomiting. Extreme swelling or bruising. Continued bleeding from incision. Increased pain, redness, or drainage from the incision.  The clinic staff is available to answer your questions during regular business hours.  Please don't hesitate to call and ask to speak to one of the nurses for clinical concerns.  If you have a medical emergency, go to the nearest emergency room or call 911.  A surgeon from [facility] is always on call at the hospital.  For further questions, please visit [email or web address]   Post Anesthesia Home Care Instructions  Activity: Get plenty of rest for the remainder of the day. A responsible individual must stay with you for 24 hours following the procedure.  For the next 24 hours, DO NOT: -Drive a car -Advertising copywriter -Drink alcoholic beverages -Take any medication unless instructed by  your physician -Make any legal decisions or sign important papers.  Meals: Start with liquid foods such as gelatin or soup. Progress to regular foods as tolerated. Avoid greasy, spicy, heavy foods. If nausea and/or vomiting occur, drink only clear liquids until the nausea and/or vomiting subsides.  Call your physician if vomiting continues.  Special Instructions/Symptoms: Your throat may feel dry or sore from the anesthesia or the breathing tube placed in your throat during surgery. If this causes discomfort, gargle with warm salt water. The discomfort should disappear within 24 hours.  If you had a scopolamine patch placed behind your ear for the management of post- operative nausea and/or vomiting:  1. The medication in the patch is effective for 72 hours, after which it should be removed.  Wrap patch in a tissue and discard in the trash. Wash hands thoroughly with soap and water. 2. You may remove the patch earlier than 72 hours if you experience unpleasant side effects which may include dry mouth, dizziness or visual disturbances. 3. Avoid touching the patch. Wash your hands with soap and water after contact with the patch.  Information for Discharge Teaching: EXPAREL  (bupivacaine  liposome injectable suspension)   Pain relief is important to your recovery. The goal is to control your pain so you can move easier and return to your normal activities as soon as possible after your procedure. Your physician may use several types of medicines to manage pain, swelling, and more.  Your surgeon or anesthesiologist gave you EXPAREL (bupivacaine ) to help control your pain after surgery.  EXPAREL  is a local anesthetic designed to release slowly over an extended period of time to provide pain relief by numbing the tissue around the surgical site. EXPAREL  is designed to release pain medication over time and can control pain for up to 72 hours. Depending on how you respond to EXPAREL , you may require less pain medication during your recovery. EXPAREL  can help reduce or eliminate the need for opioids during the first few days after surgery when pain relief is needed the most. EXPAREL  is not an opioid and is not addictive. It does not cause sleepiness or sedation.   Important! A teal colored band  has been placed on your arm with the date, time and amount of EXPAREL  you have received. Please leave this armband in place for the full 96 hours following administration, and then you may remove the band. If you return to the hospital for any reason within 96 hours following the administration of EXPAREL , the armband provides important information that your health care providers to know, and alerts them that you have received this anesthetic.    Possible side effects of EXPAREL : Temporary loss of sensation or ability to move in the area where medication was injected. Nausea, vomiting, constipation Rarely, numbness and tingling in your mouth or lips, lightheadedness, or anxiety may occur. Call your doctor right away if you think you may be experiencing any of these sensations, or if you have other questions regarding possible side effects.  Follow all other discharge instructions given to you by your surgeon or nurse. Eat a healthy diet and drink plenty of water or other fluids.  No tylenol  until after 1pm today.  No ibuprofen until after 3pm today

## 2023-11-15 NOTE — H&P (Signed)
 SABRA

## 2023-11-15 NOTE — Progress Notes (Signed)
 Last time received oxycodone  written on dc paperwork.

## 2023-11-15 NOTE — Progress Notes (Signed)
Assisted Dr. Doroteo Glassman with left, pectoralis, ultrasound guided block. Side rails up, monitors on throughout procedure. See vital signs in flow sheet. Tolerated Procedure well.

## 2023-11-15 NOTE — Anesthesia Preprocedure Evaluation (Addendum)
 Anesthesia Evaluation  Patient identified by MRN, date of birth, ID band Patient awake    Reviewed: Allergy & Precautions, H&P , NPO status , Patient's Chart, lab work & pertinent test results  Airway Mallampati: I  TM Distance: >3 FB Neck ROM: Full    Dental  (+) Teeth Intact, Dental Advisory Given   Pulmonary neg pulmonary ROS   Pulmonary exam normal breath sounds clear to auscultation       Cardiovascular negative cardio ROS Normal cardiovascular exam Rhythm:Regular Rate:Normal     Neuro/Psych negative neurological ROS  negative psych ROS   GI/Hepatic negative GI ROS, Neg liver ROS,,,  Endo/Other  negative endocrine ROS    Renal/GU negative Renal ROS  negative genitourinary   Musculoskeletal negative musculoskeletal ROS (+)    Abdominal   Peds negative pediatric ROS (+)  Hematology negative hematology ROS (+)   Anesthesia Other Findings L breast ca  Reproductive/Obstetrics Urine preg neg today                               Anesthesia Physical Anesthesia Plan  ASA: 2  Anesthesia Plan: General and Regional   Post-op Pain Management: Regional block*, Tylenol  PO (pre-op)* and Toradol  IV (intra-op)*   Induction: Intravenous  PONV Risk Score and Plan: 3 and Ondansetron , Dexamethasone , Midazolam  and Treatment may vary due to age or medical condition  Airway Management Planned: LMA  Additional Equipment:   Intra-op Plan:   Post-operative Plan: Extubation in OR  Informed Consent: I have reviewed the patients History and Physical, chart, labs and discussed the procedure including the risks, benefits and alternatives for the proposed anesthesia with the patient or authorized representative who has indicated his/her understanding and acceptance.     Dental advisory given  Plan Discussed with: CRNA  Anesthesia Plan Comments:          Anesthesia Quick Evaluation

## 2023-11-15 NOTE — Anesthesia Procedure Notes (Signed)
 Procedure Name: LMA Insertion Date/Time: 11/15/2023 8:36 AM  Performed by: Julieanne Fairy BROCKS, CRNAPre-anesthesia Checklist: Patient identified, Emergency Drugs available, Suction available and Patient being monitored Patient Re-evaluated:Patient Re-evaluated prior to induction Oxygen Delivery Method: Circle system utilized Preoxygenation: Pre-oxygenation with 100% oxygen Induction Type: IV induction Ventilation: Mask ventilation without difficulty LMA: LMA inserted LMA Size: 4.0 Number of attempts: 1 Airway Equipment and Method: Bite block Placement Confirmation: positive ETCO2 Tube secured with: Tape Dental Injury: Teeth and Oropharynx as per pre-operative assessment

## 2023-11-15 NOTE — Anesthesia Postprocedure Evaluation (Signed)
 Anesthesia Post Note  Patient: Sheryl [name]  Procedure(s) Performed: BREAST LUMPECTOMY WITH RADIOACTIVE SEED LOCALIZATION (Left: Breast) BIOPSY, LYMPH NODE, SENTINEL, AXILLARY (Left)     Patient location during evaluation: PACU Anesthesia Type: Regional and General Level of consciousness: awake and alert, oriented and patient cooperative Pain management: pain level controlled Vital Signs Assessment: post-procedure vital signs reviewed and stable Respiratory status: spontaneous breathing, nonlabored ventilation and respiratory function stable Cardiovascular status: blood pressure returned to baseline and stable Postop Assessment: no apparent nausea or vomiting Anesthetic complications: no   No notable events documented.  Last Vitals:  Vitals:   11/15/23 0947 11/15/23 1000  BP: (!) 101/47 111/65  Pulse: 66 70  Resp: 15 14  Temp:    SpO2: 100% 100%    Last Pain:  Vitals:   11/15/23 0647  TempSrc: Temporal  PainSc: 0-No pain                 [name]

## 2023-11-15 NOTE — Op Note (Signed)
  Preoperative diagnosis: Clinical stage I left breast cancer Postoperative diagnosis: Same as above Procedure: 1.  Left breast radioactive seed guided lumpectomy 2.  Injection of mag trace for sentinel lymph node identification 3.  Left deep axillary sentinel lymph node biopsy Surgeon: Dr. Adina Bury Anesthesia: General With a pectoral block Estimated blood loss: Less than 50 cc Specimens: 1.  Left breast tissue containing seed and clip marked with paint 2.  Additional anterior margin marked short superior, long lateral, double deep 3.  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 She has FH of brain tumor, prostate in dad. She has no prior breast history. She has no mass or dc. She had screening mm that shows a left breast asymmetry. US  shows a 1.1x1.1x0.6 cm mass. Axillary US  is negative. 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 Genetic testing is negative.  We discussed proceeding with lumpectomy/sn biopsy.   Procedure: After informed consent was obtained she first underwent a pectoral block.  She was given antibiotics.  SCDs were in place.  She was placed under general anesthesia without complication.  She was prepped and draped in the standard sterile surgical fashion.  Surgical timeout was then performed.   I first injected 1.5 cc of mag trace in the subareolar position and massaged this for 5 minutes.   I then located the seed in the upper inner quadrant.  This was fairly close to the skin so I elected to make a curvilinear incision overlying the seed and the cancer.  I then removed the seed in the surrounding tissue with an attempt to get clear margins in conjunction with what was seen on her 3D imaging as well.  Mammogram  confirmed removal of the seed and the clip.  I did a 3D image and I found that I thought I was close to the anterior margin and I remove these as above.  The posterior margin is now the muscle.  I placed a couple of clips in the cavity.  I then obtained hemostasis.  I then closed the breast tissue with 2-0 Vicryl after mobilizing off of the pec. I then freed the skin in the plan above the breast tissue to drape over the tissue. The skin was closed with 3-0 Vicryl and 4-0 Monocryl.  Glue Steri-Strips were eventually applied.   I then was able to identify activity in the low axilla.  I made an incision below the axillary hairline and carried this through the fascia.  I then was able to identify a couple of brown nodes that had activity and appeared normal.  There were no other brown or palpable nodes present.  There is no more activity present in the axilla.  I then obtained hemostasis.  This was closed with 2-0 Vicryl, 3-0 Vicryl and 4 Monocryl.  Glue and Steri-Strips were applied.  She tolerated this well was extubated and transferred to recovery stable.

## 2023-11-16 ENCOUNTER — Encounter (HOSPITAL_BASED_OUTPATIENT_CLINIC_OR_DEPARTMENT_OTHER): Payer: Self-pay | Admitting: General Surgery

## 2023-11-17 LAB — SURGICAL PATHOLOGY

## 2023-11-21 ENCOUNTER — Encounter: Payer: Self-pay | Admitting: *Deleted

## 2023-11-21 ENCOUNTER — Telehealth: Payer: Self-pay | Admitting: *Deleted

## 2023-11-21 NOTE — Telephone Encounter (Signed)
Ordered oncotype per Dr. Feng. Requisition sent to pathology and exact sciences. 

## 2023-11-29 DIAGNOSIS — C50212 Malignant neoplasm of upper-inner quadrant of left female breast: Secondary | ICD-10-CM | POA: Diagnosis not present

## 2023-11-29 DIAGNOSIS — Z170 Estrogen receptor positive status [ER+]: Secondary | ICD-10-CM | POA: Diagnosis not present

## 2023-12-01 ENCOUNTER — Ambulatory Visit: Payer: Self-pay | Attending: General Surgery | Admitting: Rehabilitation

## 2023-12-01 ENCOUNTER — Encounter (HOSPITAL_COMMUNITY): Payer: Self-pay

## 2023-12-01 DIAGNOSIS — C50212 Malignant neoplasm of upper-inner quadrant of left female breast: Secondary | ICD-10-CM | POA: Insufficient documentation

## 2023-12-01 DIAGNOSIS — Z170 Estrogen receptor positive status [ER+]: Secondary | ICD-10-CM | POA: Insufficient documentation

## 2023-12-01 DIAGNOSIS — M25612 Stiffness of left shoulder, not elsewhere classified: Secondary | ICD-10-CM | POA: Diagnosis not present

## 2023-12-01 DIAGNOSIS — Z483 Aftercare following surgery for neoplasm: Secondary | ICD-10-CM | POA: Insufficient documentation

## 2023-12-01 DIAGNOSIS — R293 Abnormal posture: Secondary | ICD-10-CM | POA: Insufficient documentation

## 2023-12-01 NOTE — Therapy (Signed)
 OUTPATIENT PHYSICAL THERAPY BREAST CANCER POST OP FOLLOW UP   Patient Name: Sheryl [name] MRN: [id number] DOB:October 10, 1975, 48 y.o., female Today's Date: 12/01/2023  END OF SESSION:  PT End of Session - 12/01/23 1044     Visit Number 2    Number of Visits 4    Date for PT Re-Evaluation 12/29/23    Authorization Type none    PT Start Time 1000    PT Stop Time 1035    PT Time Calculation (min) 35 min    Activity Tolerance Patient tolerated treatment well    Behavior During Therapy WFL for tasks assessed/performed          Past Medical History:  Diagnosis Date   Family history of brain cancer    Family history of pancreatic cancer    Family history of prostate cancer    Family history of uterine cancer    Past Surgical History:  Procedure Laterality Date   AXILLARY SENTINEL NODE BIOPSY Left 11/15/2023   Procedure: BIOPSY, LYMPH NODE, SENTINEL, AXILLARY;  Surgeon: Ebbie Cough, MD;  Location: [facility];  Service: General;  Laterality: Left;   BREAST BIOPSY Left 10/11/2023   US  LT BREAST BX W LOC DEV 1ST LESION IMG BX SPEC US  GUIDE 10/11/2023 [facility]   BREAST BIOPSY  11/14/2023   MM LT RADIOACTIVE SEED LOC MAMMO GUIDE 11/14/2023 [facility]   BREAST LUMPECTOMY WITH RADIOACTIVE SEED LOCALIZATION Left 11/15/2023   Procedure: BREAST LUMPECTOMY WITH RADIOACTIVE SEED LOCALIZATION;  Surgeon: Ebbie Cough, MD;  Location: [facility];  Service: General;  Laterality: Left;   MENISCUS REPAIR Right    Patient Active Problem List   Diagnosis Date Noted   Genetic testing 10/30/2023   Family history of pancreatic cancer    Family history of prostate cancer    Family history of uterine cancer    Family history of brain cancer    Malignant neoplasm of upper-inner quadrant of left breast in female, estrogen receptor positive (HCC) 10/17/2023    REFERRING PROVIDER: Cough Ebbie, MD  REFERRING DIAG:  Diagnosis   C50.212,Z17.0 (ICD-10-CM) - Malignant neoplasm of upper-inner quadrant of left breast in female, estrogen receptor positive (HCC)    THERAPY DIAG:  Malignant neoplasm of upper-inner quadrant of left breast in female, estrogen receptor positive (HCC)  Abnormal posture  Aftercare following surgery for neoplasm  Stiffness of left shoulder, not elsewhere classified  Rationale for Evaluation and Treatment: Rehabilitation  ONSET DATE: 10/09/23  SUBJECTIVE:  SUBJECTIVE STATEMENT: My skin hurts.  Incisions a healing well.  Not feeling swollen.    PERTINENT HISTORY:  Patient was diagnosed on 10/09/23 with left grade 2 IDC with DCIS. It measures 1 cm and is located in the upper-inner quadrant. It is ER/PR positive, HER2 neg with a Ki67 of 1%. lumpectomy with SLNB 11/15/23 with 4 negative nodes removed.  Will have radiation.    PATIENT GOALS:  Reassess how my recovery is going related to arm function, pain, and swelling.  PAIN:  Are you having pain? No More so pulling   PRECAUTIONS: Recent Surgery, left UE Lymphedema risk  RED FLAGS: None   ACTIVITY LEVEL / LEISURE: Back to normal    OBJECTIVE:   PATIENT SURVEYS:  QUICK DASH: 15% from 0%  OBSERVATIONS: Cording noted in axilla with flexion and abduction, breast incision with steristrips still present,    POSTURE:  WNL  LYMPHEDEMA ASSESSMENT:   UPPER EXTREMITY AROM/PROM:   A/PROM RIGHT   eval     Shoulder extension 58   Shoulder flexion 165   Shoulder abduction 165   Shoulder internal rotation 70   Shoulder external rotation 110                           (Blank rows = not tested)   A/PROM LEFT   eval 12/01/23  Shoulder extension 57 60  Shoulder flexion 150 150  Shoulder abduction 165 155  Shoulder internal rotation 70 75  Shoulder  external rotation 95 85                          (Blank rows = not tested)   CERVICAL AROM: All within normal limits:    UPPER EXTREMITY STRENGTH:  5/5 no pain   LYMPHEDEMA ASSESSMENTS (in cm):    LANDMARK RIGHT   eval  10 cm proximal to olecranon process 22  Olecranon process 21.1  10 cm proximal to ulnar styloid process 16.1  Just proximal to ulnar styloid process 15.6  Across hand at thumb web space 18.0  At base of 2nd digit 6.0  (Blank rows = not tested)   LANDMARK LEFT   eval 12/01/23  10 cm proximal to olecranon process 22.3 22.3  Olecranon process 21.1 21.1  10 cm proximal to ulnar styloid process 15.6 15.6  Just proximal to ulnar styloid process 14.7 14.7  Across hand at thumb web space 17.6 17.8  At base of 2nd digit 5.7 5.8  (Blank rows = not tested)  PATIENT EDUCATION:  Education details: post op instruction per below Person educated: Patient Education method: Medical illustrator Education comprehension: verbalized understanding and returned demonstration  HOME EXERCISE PROGRAM: Reviewed previously given post op HEP. Updated to supine chest stretch and flexion, single arm doorway stretch   ASSESSMENT:  CLINICAL IMPRESSION: Pt is doing very well but does have some cording and decreased abduction AROM and would benefit from a few more visits for work on the cording.   Pt will benefit from skilled therapeutic intervention to improve on the following deficits: Decreased knowledge of precautions, impaired UE functional use, pain, decreased ROM, postural dysfunction.   PT treatment/interventions: ADL/Self care home management, 97110-Therapeutic exercises, 97530- Therapeutic activity, [id number]- Neuromuscular re-education, 97535- Self Care, and 02859- Manual therapy   GOALS: Goals reviewed with patient? Yes  LONG TERM GOALS:  (STG=LTG)  GOALS Name Target Date  Goal status  1 Pt will demonstrate  she has regained full shoulder ROM and function post  operatively compared to baselines.  Baseline: 12/29/23 INITIAL  2 Pt will be educated on lymphedema surveillance and risk reduction 12/29/23 MET               PLAN:  PT FREQUENCY/DURATION: 1x per week x up to 4 weeks   PLAN FOR NEXT SESSION: Left cording MT and stretches    [facility]  [address], [address]  [place] [address]  [phone]  After Breast Cancer Class Video It is recommended you view the ABC class video to be educated on lymphedema risk reduction. This video lasts for about 30 minutes. It can be viewed on our website here: [email or web address]  Scar massage You can begin gentle scar massage to you incision sites. Gently place one hand on the incision and move the skin (without sliding on the skin) in various directions. Do this for a few minutes and then you can gently massage either coconut oil or vitamin E cream into the scars.  Compression garment You should continue wearing your compression bra until you feel like you no longer have swelling.  Home exercise Program Continue doing the exercises you were given until you feel like you can do them without feeling any tightness at the end.   Walking Program Studies show that 30 minutes of walking per day (fast enough to elevate your heart rate) can significantly reduce the risk of a cancer recurrence. If you can't walk due to other medical reasons, we encourage you to find another activity you could do (like a stationary bike or water exercise).  Posture After breast cancer surgery, people frequently sit with rounded shoulders posture because it puts their incisions on slack and feels better. If you sit like this and scar tissue forms in that position, you can become very tight and have pain sitting or standing with good posture. Try to be aware of your posture and sit and stand up tall to heal properly.  Follow up PT: It is  recommended you return every 3 months for the first 3 years following surgery to be assessed on the SOZO machine for an L-Dex score. This helps prevent clinically significant lymphedema in 95% of patients. These follow up screens are 10 minute appointments that you are not billed for.  Larue Saddie SAUNDERS, PT 12/01/2023, 10:44 AM

## 2023-12-05 ENCOUNTER — Encounter: Payer: Self-pay | Admitting: *Deleted

## 2023-12-05 DIAGNOSIS — Z170 Estrogen receptor positive status [ER+]: Secondary | ICD-10-CM

## 2023-12-05 DIAGNOSIS — C50212 Malignant neoplasm of upper-inner quadrant of left female breast: Secondary | ICD-10-CM

## 2023-12-06 ENCOUNTER — Telehealth: Payer: Self-pay | Admitting: *Deleted

## 2023-12-06 NOTE — Telephone Encounter (Signed)
 Received oncotype results 4/3%.  Patient is aware.

## 2023-12-12 ENCOUNTER — Encounter: Payer: Self-pay | Admitting: *Deleted

## 2023-12-12 ENCOUNTER — Ambulatory Visit: Admitting: Rehabilitation

## 2023-12-12 ENCOUNTER — Encounter: Payer: Self-pay | Admitting: Rehabilitation

## 2023-12-12 DIAGNOSIS — C50212 Malignant neoplasm of upper-inner quadrant of left female breast: Secondary | ICD-10-CM | POA: Diagnosis not present

## 2023-12-12 DIAGNOSIS — R293 Abnormal posture: Secondary | ICD-10-CM | POA: Diagnosis not present

## 2023-12-12 DIAGNOSIS — Z170 Estrogen receptor positive status [ER+]: Secondary | ICD-10-CM

## 2023-12-12 DIAGNOSIS — Z483 Aftercare following surgery for neoplasm: Secondary | ICD-10-CM | POA: Diagnosis not present

## 2023-12-12 DIAGNOSIS — M25612 Stiffness of left shoulder, not elsewhere classified: Secondary | ICD-10-CM | POA: Diagnosis not present

## 2023-12-12 NOTE — Therapy (Signed)
 OUTPATIENT PHYSICAL THERAPY BREAST CANCER POST OP FOLLOW UP   Patient Name: Sheryl [name] MRN: [id number] DOB:04/10/1976, 48 y.o., female Today's Date: 12/12/2023  END OF SESSION:  PT End of Session - 12/12/23 1148     Visit Number 3    Number of Visits 4    Date for PT Re-Evaluation 12/29/23    PT Start Time 1100    PT Stop Time 1149    PT Time Calculation (min) 49 min    Activity Tolerance Patient tolerated treatment well    Behavior During Therapy WFL for tasks assessed/performed           Past Medical History:  Diagnosis Date   Family history of brain cancer    Family history of pancreatic cancer    Family history of prostate cancer    Family history of uterine cancer    Past Surgical History:  Procedure Laterality Date   AXILLARY SENTINEL NODE BIOPSY Left 11/15/2023   Procedure: BIOPSY, LYMPH NODE, SENTINEL, AXILLARY;  Surgeon: Ebbie Cough, MD;  Location: [facility];  Service: General;  Laterality: Left;   BREAST BIOPSY Left 10/11/2023   US  LT BREAST BX W LOC DEV 1ST LESION IMG BX SPEC US  GUIDE 10/11/2023 [facility]   BREAST BIOPSY  11/14/2023   MM LT RADIOACTIVE SEED LOC MAMMO GUIDE 11/14/2023 [facility]   BREAST LUMPECTOMY WITH RADIOACTIVE SEED LOCALIZATION Left 11/15/2023   Procedure: BREAST LUMPECTOMY WITH RADIOACTIVE SEED LOCALIZATION;  Surgeon: Ebbie Cough, MD;  Location: [facility];  Service: General;  Laterality: Left;   MENISCUS REPAIR Right    Patient Active Problem List   Diagnosis Date Noted   Genetic testing 10/30/2023   Family history of pancreatic cancer    Family history of prostate cancer    Family history of uterine cancer    Family history of brain cancer    Malignant neoplasm of upper-inner quadrant of left breast in female, estrogen receptor positive (HCC) 10/17/2023    REFERRING PROVIDER: Cough Ebbie, MD  REFERRING DIAG:  Diagnosis  C50.212,Z17.0 (ICD-10-CM) -  Malignant neoplasm of upper-inner quadrant of left breast in female, estrogen receptor positive (HCC)    THERAPY DIAG:  Malignant neoplasm of upper-inner quadrant of left breast in female, estrogen receptor positive (HCC)  Abnormal posture  Aftercare following surgery for neoplasm  Stiffness of left shoulder, not elsewhere classified  Rationale for Evaluation and Treatment: Rehabilitation  ONSET DATE: 10/09/23  SUBJECTIVE:  SUBJECTIVE STATEMENT: I was raking the grass and maybe my arm is a bit sore.  I think the cord is now by the ribs.    PERTINENT HISTORY:  Patient was diagnosed on 10/09/23 with left grade 2 IDC with DCIS. It measures 1 cm and is located in the upper-inner quadrant. It is ER/PR positive, HER2 neg with a Ki67 of 1%. lumpectomy with SLNB 11/15/23 with 4 negative nodes removed.  Will have radiation.    PATIENT GOALS:  Reassess how my recovery is going related to arm function, pain, and swelling.  PAIN:  Are you having pain? No More so pulling   PRECAUTIONS: Recent Surgery, left UE Lymphedema risk  RED FLAGS: None   ACTIVITY LEVEL / LEISURE: Back to normal    OBJECTIVE:   PATIENT SURVEYS:  QUICK DASH: 15% from 0%  OBSERVATIONS: Cording noted in axilla with flexion and abduction, breast incision with steristrips still present,    POSTURE:  WNL  LYMPHEDEMA ASSESSMENT:   UPPER EXTREMITY AROM/PROM:   A/PROM RIGHT   eval     Shoulder extension 58   Shoulder flexion 165   Shoulder abduction 165   Shoulder internal rotation 70   Shoulder external rotation 110                           (Blank rows = not tested)   A/PROM LEFT   eval 12/01/23 12/12/23  Shoulder extension 57 60 60  Shoulder flexion 150 150 155  Shoulder abduction 165 155 165  Shoulder internal rotation  70 75 75  Shoulder external rotation 95 85 95                          (Blank rows = not tested)   CERVICAL AROM: All within normal limits:    UPPER EXTREMITY STRENGTH:  5/5 no pain   LYMPHEDEMA ASSESSMENTS (in cm):    LANDMARK RIGHT   eval  10 cm proximal to olecranon process 22  Olecranon process 21.1  10 cm proximal to ulnar styloid process 16.1  Just proximal to ulnar styloid process 15.6  Across hand at thumb web space 18.0  At base of 2nd digit 6.0  (Blank rows = not tested)   LANDMARK LEFT   eval 12/01/23  10 cm proximal to olecranon process 22.3 22.3  Olecranon process 21.1 21.1  10 cm proximal to ulnar styloid process 15.6 15.6  Just proximal to ulnar styloid process 14.7 14.7  Across hand at thumb web space 17.6 17.8  At base of 2nd digit 5.7 5.8  (Blank rows = not tested)  TODAYS TREATMENT:  12/12/23 Rechecked status - ROM is now full but a cord is visible at the left lower ribs into the abdomen Pulleys into flexion easy and abduction x - feels a mild pull LTR - easy  No pull with sidelying mermaid stretch or dowel cane flexion narrow and wide Supine hooklying STM and release to the cording at the left ribs towards the ASIS with opposing hands force and C and S pressure.  No cording noted in axilla or arm today.   Edu on thinking about if she feels it stretch during any ADLs and that that same position would be a good stretch.    PATIENT EDUCATION:  Education details: post op instruction per below Person educated: Patient Education method: Medical illustrator Education comprehension: verbalized understanding and returned demonstration  HOME EXERCISE PROGRAM: Reviewed previously given post op HEP. Updated to supine chest stretch and flexion, single arm doorway stretch   ASSESSMENT:  CLINICAL IMPRESSION: Pt is doing very well with return to full ROM but she now was a new cord in the left upper abdomen and ribs under the breast.  This cord  is not causing any pull with stretches and is ard to feel in supine but is visible in standing.  Focused on MFR today.   Pt will benefit from skilled therapeutic intervention to improve on the following deficits: Decreased knowledge of precautions, impaired UE functional use, pain, decreased ROM, postural dysfunction.   PT treatment/interventions: ADL/Self care home management, 97110-Therapeutic exercises, 97530- Therapeutic activity, [id number]- Neuromuscular re-education, 97535- Self Care, and 02859- Manual therapy   GOALS: Goals reviewed with patient? Yes  LONG TERM GOALS:  (STG=LTG)  GOALS Name Target Date  Goal status  1 Pt will demonstrate she has regained full shoulder ROM and function post operatively compared to baselines.  Baseline: 12/29/23 MET  2 Pt will be educated on lymphedema surveillance and risk reduction 12/29/23 MET               PLAN:  PT FREQUENCY/DURATION: 1x per week x up to 4 weeks   PLAN FOR NEXT SESSION: Left cording MT and stretches    [facility]  [address], [address]  [place] [address]  [phone]  After Breast Cancer Class Video It is recommended you view the ABC class video to be educated on lymphedema risk reduction. This video lasts for about 30 minutes. It can be viewed on our website here: [email or web address]  Scar massage You can begin gentle scar massage to you incision sites. Gently place one hand on the incision and move the skin (without sliding on the skin) in various directions. Do this for a few minutes and then you can gently massage either coconut oil or vitamin E cream into the scars.  Compression garment You should continue wearing your compression bra until you feel like you no longer have swelling.  Home exercise Program Continue doing the exercises you were given until you feel like you can do them without feeling any tightness at the  end.   Walking Program Studies show that 30 minutes of walking per day (fast enough to elevate your heart rate) can significantly reduce the risk of a cancer recurrence. If you can't walk due to other medical reasons, we encourage you to find another activity you could do (like a stationary bike or water exercise).  Posture After breast cancer surgery, people frequently sit with rounded shoulders posture because it puts their incisions on slack and feels better. If you sit like this and scar tissue forms in that position, you can become very tight and have pain sitting or standing with good posture. Try to be aware of your posture and sit and stand up tall to heal properly.  Follow up PT: It is recommended you return every 3 months for the first 3 years following surgery to be assessed on the SOZO machine for an L-Dex score. This helps prevent clinically significant lymphedema in 95% of patients. These follow up screens are 10 minute appointments that you are not billed for.  Larue Saddie SAUNDERS, PT 12/12/2023, 11:49 AM

## 2023-12-19 ENCOUNTER — Ambulatory Visit: Payer: Self-pay | Admitting: Rehabilitation

## 2023-12-20 NOTE — Progress Notes (Signed)
 New Breast Cancer Diagnosis: Left Breast UIQ   Histology per Pathology Report: grade 2, Invasive Lobular Carcinoma  Receptor Status: ER(positive), PR (positive), Her2-neu (negative), Ki-(1%)   Surgeon and surgical plan, if any:  Dr. Ebbie -Left Breast Lumpectomy with radioactive seed localization 11/15/2023 -Follow-up completed last week.   Medical oncologist, treatment if any:    Family History of Breast/Ovarian/Prostate Cancer: Father had prostate cancer.  Lymphedema issues, if any: No       Pain issues, if any: She has some tenderness since surgery.     SAFETY ISSUES: Prior radiation? No Pacemaker/ICD? No Possible current pregnancy? Having Cycles, No birth control Is the patient on methotrexate? No  Current Complaints / other details:

## 2023-12-21 ENCOUNTER — Ambulatory Visit
Admission: RE | Admit: 2023-12-21 | Discharge: 2023-12-21 | Disposition: A | Discharge status: Home / Self Care | Source: Ambulatory Visit | Attending: Radiation Oncology | Admitting: Radiation Oncology

## 2023-12-21 ENCOUNTER — Encounter: Payer: Self-pay | Admitting: Radiation Oncology

## 2023-12-21 ENCOUNTER — Other Ambulatory Visit: Payer: Self-pay

## 2023-12-21 VITALS — Ht 66.0 in | Wt 105.0 lb

## 2023-12-21 DIAGNOSIS — C50212 Malignant neoplasm of upper-inner quadrant of left female breast: Secondary | ICD-10-CM | POA: Diagnosis not present

## 2023-12-21 DIAGNOSIS — Z170 Estrogen receptor positive status [ER+]: Secondary | ICD-10-CM | POA: Diagnosis not present

## 2023-12-21 NOTE — Progress Notes (Signed)
 Radiation Oncology         ([phone]) [phone] ________________________________  Name: Sheryl [name]        MRN: [id number]  Date of Service: 12/21/2023 DOB: 03-01-1976  CC:Pcp, No  Lanny Callander, MD     REFERRING PHYSICIAN: Lanny Callander, MD   DIAGNOSIS: The encounter diagnosis was Malignant neoplasm of upper-inner quadrant of left breast in female, estrogen receptor positive (HCC).   HISTORY OF PRESENT ILLNESS: Sheryl [name] is a 48 y.o. female seen in the multidisciplinary breast clinic for a new diagnosis of left breast cancer. The patient presented for screening mammogram which identified a possible left breast asymmetry.  Further diagnostic workup on 10/09/2023 showed a persistent area in the left breast over the posterior upper inner quadrant felt to be a possible spiculated mass.  By ultrasound this was confirmed as a mass in the 1 o'clock position measuring up to 1 cm.  Her left axilla was negative for adenopathy. 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  Her cancer was ER positive, PR positive, HER2 negative with a Ki-67 of 1%.    Since her lats visit she underwent lumpectomy with Dr. Ebbie on 11/15/2023.  This showed a grade 2 invasive lobular carcinoma measuring 1 cm with associated LCIS.  Her margins were negative for invasive disease or LCIS.  Additional anterior margin was negative for carcinoma and 4 sampled lymph nodes were all negative for disease.  An Oncotype Dx score was performed and revealed result of 4 so no chemotherapy is planned by Dr. Lanny but she will recommend antiestrogen therapy.  She is seen today via MyChart to discuss adjuvant radiotherapy.  PREVIOUS RADIATION THERAPY: No   PAST MEDICAL HISTORY:  Past Medical History:  Diagnosis Date   Breast cancer (HCC) 10/2023   Left Breast   Family history of brain cancer    Family history of pancreatic cancer    Family history of prostate cancer     Family history of uterine cancer        PAST SURGICAL HISTORY: Past Surgical History:  Procedure Laterality Date   AXILLARY SENTINEL NODE BIOPSY Left 11/15/2023   Procedure: BIOPSY, LYMPH NODE, SENTINEL, AXILLARY;  Surgeon: Ebbie Cough, MD;  Location: [facility];  Service: General;  Laterality: Left;   BREAST BIOPSY Left 10/11/2023   US  LT BREAST BX W LOC DEV 1ST LESION IMG BX SPEC US  GUIDE 10/11/2023 [facility]   BREAST BIOPSY  11/14/2023   [name] LT RADIOACTIVE SEED LOC MAMMO GUIDE 11/14/2023 [facility]   BREAST LUMPECTOMY WITH RADIOACTIVE SEED LOCALIZATION Left 11/15/2023   Procedure: BREAST LUMPECTOMY WITH RADIOACTIVE SEED LOCALIZATION;  Surgeon: Ebbie Cough, MD;  Location: [facility];  Service: General;  Laterality: Left;   MENISCUS REPAIR Right      FAMILY HISTORY:  Family History  Problem Relation Age of Onset   Lung cancer Father    Prostate cancer Father 8   Brain cancer Paternal Uncle    Uterine cancer Maternal Grandmother        dx < 50   Pancreatic cancer Maternal Grandfather    Acute myelogenous leukemia Paternal Cousin 17       paternal first cousin's son     SOCIAL HISTORY:  reports that she has never smoked. She has never used smokeless tobacco. She reports current alcohol use. She reports that she does not use drugs. The patient is married and lives in  [place]. 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 She is accompanied by her [name]. They have two sons in college and their youngest son is a Printmaker in high school at [employer].    ALLERGIES: Patient has no known allergies.   MEDICATIONS:  No current outpatient medications on file.   No current facility-administered medications for this encounter.     REVIEW OF SYSTEMS: On review of systems, the patient reports that she is doing well without concerns of her healing. No other complaints are verbalized.      PHYSICAL EXAM:   Unable to assess vitals due to encounter type  In general this is a well appearing caucasian female in no acute distress. She's alert and oriented x4 and appropriate throughout the examination. Cardiopulmonary assessment is negative for acute distress and she exhibits normal effort. Bilateral breast exam is deferred.    ECOG = 0  0 - Asymptomatic (Fully active, able to carry on all predisease activities without restriction)  1 - Symptomatic but completely ambulatory (Restricted in physically strenuous activity but ambulatory and able to carry out work of a light or sedentary nature. For example, light housework, office work)  2 - Symptomatic, <50% in bed during the day (Ambulatory and capable of all self care but unable to carry out any work activities. Up and about more than 50% of waking hours)  3 - Symptomatic, >50% in bed, but not bedbound (Capable of only limited self-care, confined to bed or chair 50% or more of waking hours)  4 - Bedbound (Completely disabled. Cannot carry on any self-care. Totally confined to bed or chair)  5 - Death   Sheryl [name], Creech RH, Tormey DC, et al. [phone]). Toxicity and response criteria of the [employer]. [name]    LABORATORY DATA:  Lab Results  Component Value Date   WBC 3.8 (L) 10/31/2023   HGB 14.1 10/31/2023   HCT 42.4 10/31/2023   MCV 92.2 10/31/2023   PLT 193 10/31/2023   Lab Results  Component Value Date   NA 140 10/31/2023   K 4.0 10/31/2023   CL 105 10/31/2023   CO2 31 10/31/2023   Lab Results  Component Value Date   ALT 12 10/31/2023   AST 14 (L) 10/31/2023   ALKPHOS 35 (L) 10/31/2023   BILITOT 0.4 10/31/2023      RADIOGRAPHY: No results found.      IMPRESSION/PLAN: 1. Stage IA, [id number] grade 2, ER/PR positive invasive lobular carcinoma of the left breast. Dr. Dewey has reviewed her final pathology findings and today we discussed the nature of early stage breast  disease.  She has been doing well since her surgery and based on her Oncotype DX score, Dr. Lanny does not recommend chemotherapy.  We reviewed Dr. Smitty recommendation to proceed with external radiotherapy to the breast  to reduce risks of local recurrence. Dr. Lanny anticipates adjuvant antiestrogen therapy to follow. We discussed the risks, benefits, short, and long term effects of radiotherapy, as well as the curative intent, and the patient is interested in proceeding. Dr. Dewey discusses the delivery and logistics of radiotherapy and anticipates a course of 4  weeks of radiotherapy to the left breast with deep inspiration breath-hold technique. She will come for simulation tomorrow and will sign written consent to proceed.  2.  Contraceptive Counseling. The patient's contraception is her [name]'s vasectomy procedure. No pregnancy testing is needed prior to radiotherapy.   This  encounter was provided by telemedicine platform MyChart.  The patient has provided two factor identification and has given verbal consent for this type of encounter and has been advised to only accept a meeting of this type in a secure network environment. The time spent during this encounter was 35 minutes including preparation, discussion, and coordination of the patient's care. The attendants for this meeting include Sheryl Cary, RN,  Sheryl [name]  and Sheryl [name].  During the encounter,  Sheryl Cary, RN and  Sheryl [name] were located at [facility] Radiation Oncology Department.  Sheryl [name] was located at home.      Sheryl [name], [facility]    **Disclaimer: This note was dictated with voice recognition software. Similar sounding words can inadvertently be transcribed and this note may contain transcription errors which may not have been corrected upon publication of note.**

## 2023-12-22 ENCOUNTER — Ambulatory Visit
Admission: RE | Admit: 2023-12-22 | Discharge: 2023-12-22 | Disposition: A | Discharge status: Home / Self Care | Source: Ambulatory Visit | Attending: Radiation Oncology | Admitting: Radiation Oncology

## 2023-12-22 DIAGNOSIS — C50212 Malignant neoplasm of upper-inner quadrant of left female breast: Secondary | ICD-10-CM | POA: Diagnosis not present

## 2023-12-22 DIAGNOSIS — Z170 Estrogen receptor positive status [ER+]: Secondary | ICD-10-CM | POA: Diagnosis not present

## 2023-12-28 ENCOUNTER — Encounter: Payer: Self-pay | Admitting: *Deleted

## 2023-12-28 DIAGNOSIS — C50212 Malignant neoplasm of upper-inner quadrant of left female breast: Secondary | ICD-10-CM

## 2023-12-29 ENCOUNTER — Telehealth: Payer: Self-pay | Admitting: Hematology

## 2023-12-29 NOTE — Telephone Encounter (Signed)
 Called and spoke with pt regarding upcoming  appt.

## 2024-01-02 ENCOUNTER — Ambulatory Visit
Admission: RE | Admit: 2024-01-02 | Discharge: 2024-01-02 | Disposition: A | Discharge status: Home / Self Care | Source: Ambulatory Visit | Attending: Radiation Oncology | Admitting: Radiation Oncology

## 2024-01-02 DIAGNOSIS — C50212 Malignant neoplasm of upper-inner quadrant of left female breast: Secondary | ICD-10-CM | POA: Diagnosis not present

## 2024-01-02 DIAGNOSIS — Z170 Estrogen receptor positive status [ER+]: Secondary | ICD-10-CM | POA: Diagnosis not present

## 2024-01-03 ENCOUNTER — Other Ambulatory Visit: Payer: Self-pay

## 2024-01-03 DIAGNOSIS — Z510 Encounter for antineoplastic radiation therapy: Secondary | ICD-10-CM | POA: Diagnosis not present

## 2024-01-03 DIAGNOSIS — Z170 Estrogen receptor positive status [ER+]: Secondary | ICD-10-CM | POA: Diagnosis not present

## 2024-01-03 DIAGNOSIS — C50212 Malignant neoplasm of upper-inner quadrant of left female breast: Secondary | ICD-10-CM | POA: Diagnosis not present

## 2024-01-03 LAB — RAD ONC ARIA SESSION SUMMARY
Course Elapsed Days: 0
Plan Fractions Treated to Date: 1
Plan Prescribed Dose Per Fraction: 2.66 Gy
Plan Total Fractions Prescribed: 16
Plan Total Prescribed Dose: 42.56 Gy
Reference Point Dosage Given to Date: 2.66 Gy
Reference Point Session Dosage Given: 2.66 Gy
Session Number: 1

## 2024-01-04 ENCOUNTER — Ambulatory Visit
Admission: RE | Admit: 2024-01-04 | Discharge: 2024-01-04 | Disposition: A | Discharge status: Home / Self Care | Source: Ambulatory Visit | Attending: Radiation Oncology | Admitting: Radiation Oncology

## 2024-01-04 ENCOUNTER — Other Ambulatory Visit: Payer: Self-pay

## 2024-01-04 DIAGNOSIS — Z170 Estrogen receptor positive status [ER+]: Secondary | ICD-10-CM | POA: Diagnosis not present

## 2024-01-04 DIAGNOSIS — C50212 Malignant neoplasm of upper-inner quadrant of left female breast: Secondary | ICD-10-CM | POA: Diagnosis not present

## 2024-01-04 DIAGNOSIS — Z510 Encounter for antineoplastic radiation therapy: Secondary | ICD-10-CM | POA: Diagnosis not present

## 2024-01-04 LAB — RAD ONC ARIA SESSION SUMMARY
Course Elapsed Days: 1
Plan Fractions Treated to Date: 2
Plan Prescribed Dose Per Fraction: 2.66 Gy
Plan Total Fractions Prescribed: 16
Plan Total Prescribed Dose: 42.56 Gy
Reference Point Dosage Given to Date: 5.32 Gy
Reference Point Session Dosage Given: 2.66 Gy
Session Number: 2

## 2024-01-05 ENCOUNTER — Other Ambulatory Visit: Payer: Self-pay

## 2024-01-05 ENCOUNTER — Ambulatory Visit
Admission: RE | Admit: 2024-01-05 | Discharge: 2024-01-05 | Disposition: A | Discharge status: Home / Self Care | Source: Ambulatory Visit | Attending: Radiation Oncology | Admitting: Radiation Oncology

## 2024-01-05 DIAGNOSIS — Z510 Encounter for antineoplastic radiation therapy: Secondary | ICD-10-CM | POA: Diagnosis not present

## 2024-01-05 DIAGNOSIS — C50212 Malignant neoplasm of upper-inner quadrant of left female breast: Secondary | ICD-10-CM

## 2024-01-05 DIAGNOSIS — Z170 Estrogen receptor positive status [ER+]: Secondary | ICD-10-CM | POA: Diagnosis not present

## 2024-01-05 LAB — RAD ONC ARIA SESSION SUMMARY
Course Elapsed Days: 2
Plan Fractions Treated to Date: 3
Plan Prescribed Dose Per Fraction: 2.66 Gy
Plan Total Fractions Prescribed: 16
Plan Total Prescribed Dose: 42.56 Gy
Reference Point Dosage Given to Date: 7.98 Gy
Reference Point Session Dosage Given: 2.66 Gy
Session Number: 3

## 2024-01-05 MED ORDER — RADIAPLEXRX EX GEL: Freq: Once | CUTANEOUS | Status: AC

## 2024-01-05 MED ORDER — ALRA NON-METALLIC DEODORANT (RAD-ONC)
1.0000 | Freq: Once | TOPICAL | Status: AC
  Administered 2024-01-05: 1 via TOPICAL

## 2024-01-08 ENCOUNTER — Ambulatory Visit
Admission: RE | Admit: 2024-01-08 | Discharge: 2024-01-08 | Disposition: A | Discharge status: Home / Self Care | Source: Ambulatory Visit | Attending: Radiation Oncology | Admitting: Radiation Oncology

## 2024-01-08 ENCOUNTER — Other Ambulatory Visit: Payer: Self-pay

## 2024-01-08 DIAGNOSIS — Z510 Encounter for antineoplastic radiation therapy: Secondary | ICD-10-CM | POA: Diagnosis not present

## 2024-01-08 DIAGNOSIS — Z170 Estrogen receptor positive status [ER+]: Secondary | ICD-10-CM | POA: Diagnosis not present

## 2024-01-08 DIAGNOSIS — C50212 Malignant neoplasm of upper-inner quadrant of left female breast: Secondary | ICD-10-CM | POA: Diagnosis not present

## 2024-01-08 LAB — RAD ONC ARIA SESSION SUMMARY
Course Elapsed Days: 5
Plan Fractions Treated to Date: 4
Plan Prescribed Dose Per Fraction: 2.66 Gy
Plan Total Fractions Prescribed: 16
Plan Total Prescribed Dose: 42.56 Gy
Reference Point Dosage Given to Date: 10.64 Gy
Reference Point Session Dosage Given: 2.66 Gy
Session Number: 4

## 2024-01-09 ENCOUNTER — Other Ambulatory Visit: Payer: Self-pay

## 2024-01-09 ENCOUNTER — Ambulatory Visit
Admission: RE | Admit: 2024-01-09 | Discharge: 2024-01-09 | Disposition: A | Discharge status: Home / Self Care | Source: Ambulatory Visit | Attending: Radiation Oncology | Admitting: Radiation Oncology

## 2024-01-09 DIAGNOSIS — C50212 Malignant neoplasm of upper-inner quadrant of left female breast: Secondary | ICD-10-CM | POA: Diagnosis not present

## 2024-01-09 DIAGNOSIS — Z170 Estrogen receptor positive status [ER+]: Secondary | ICD-10-CM | POA: Diagnosis not present

## 2024-01-09 DIAGNOSIS — Z510 Encounter for antineoplastic radiation therapy: Secondary | ICD-10-CM | POA: Diagnosis not present

## 2024-01-09 LAB — RAD ONC ARIA SESSION SUMMARY
Course Elapsed Days: 6
Plan Fractions Treated to Date: 5
Plan Prescribed Dose Per Fraction: 2.66 Gy
Plan Total Fractions Prescribed: 16
Plan Total Prescribed Dose: 42.56 Gy
Reference Point Dosage Given to Date: 13.3 Gy
Reference Point Session Dosage Given: 2.66 Gy
Session Number: 5

## 2024-01-10 ENCOUNTER — Ambulatory Visit
Admission: RE | Admit: 2024-01-10 | Discharge: 2024-01-10 | Disposition: A | Discharge status: Home / Self Care | Source: Ambulatory Visit | Attending: Radiation Oncology | Admitting: Radiation Oncology

## 2024-01-10 ENCOUNTER — Other Ambulatory Visit: Payer: Self-pay

## 2024-01-10 DIAGNOSIS — C50212 Malignant neoplasm of upper-inner quadrant of left female breast: Secondary | ICD-10-CM | POA: Diagnosis not present

## 2024-01-10 DIAGNOSIS — Z170 Estrogen receptor positive status [ER+]: Secondary | ICD-10-CM | POA: Diagnosis not present

## 2024-01-10 DIAGNOSIS — Z510 Encounter for antineoplastic radiation therapy: Secondary | ICD-10-CM | POA: Diagnosis not present

## 2024-01-10 LAB — RAD ONC ARIA SESSION SUMMARY
Course Elapsed Days: 7
Plan Fractions Treated to Date: 6
Plan Prescribed Dose Per Fraction: 2.66 Gy
Plan Total Fractions Prescribed: 16
Plan Total Prescribed Dose: 42.56 Gy
Reference Point Dosage Given to Date: 15.96 Gy
Reference Point Session Dosage Given: 2.66 Gy
Session Number: 6

## 2024-01-11 ENCOUNTER — Ambulatory Visit
Admission: RE | Admit: 2024-01-11 | Discharge: 2024-01-11 | Disposition: A | Discharge status: Home / Self Care | Source: Ambulatory Visit | Attending: Radiation Oncology | Admitting: Radiation Oncology

## 2024-01-11 ENCOUNTER — Other Ambulatory Visit: Payer: Self-pay

## 2024-01-11 DIAGNOSIS — Z170 Estrogen receptor positive status [ER+]: Secondary | ICD-10-CM | POA: Diagnosis not present

## 2024-01-11 DIAGNOSIS — C50212 Malignant neoplasm of upper-inner quadrant of left female breast: Secondary | ICD-10-CM | POA: Diagnosis not present

## 2024-01-11 DIAGNOSIS — Z510 Encounter for antineoplastic radiation therapy: Secondary | ICD-10-CM | POA: Diagnosis not present

## 2024-01-11 LAB — RAD ONC ARIA SESSION SUMMARY
Course Elapsed Days: 8
Plan Fractions Treated to Date: 7
Plan Prescribed Dose Per Fraction: 2.66 Gy
Plan Total Fractions Prescribed: 16
Plan Total Prescribed Dose: 42.56 Gy
Reference Point Dosage Given to Date: 18.62 Gy
Reference Point Session Dosage Given: 2.66 Gy
Session Number: 7

## 2024-01-12 ENCOUNTER — Ambulatory Visit
Admission: RE | Admit: 2024-01-12 | Discharge: 2024-01-12 | Disposition: A | Discharge status: Home / Self Care | Source: Ambulatory Visit | Attending: Radiation Oncology | Admitting: Radiation Oncology

## 2024-01-12 ENCOUNTER — Other Ambulatory Visit: Payer: Self-pay

## 2024-01-12 DIAGNOSIS — C50212 Malignant neoplasm of upper-inner quadrant of left female breast: Secondary | ICD-10-CM | POA: Diagnosis not present

## 2024-01-12 DIAGNOSIS — Z510 Encounter for antineoplastic radiation therapy: Secondary | ICD-10-CM | POA: Diagnosis not present

## 2024-01-12 DIAGNOSIS — Z170 Estrogen receptor positive status [ER+]: Secondary | ICD-10-CM | POA: Diagnosis not present

## 2024-01-12 LAB — RAD ONC ARIA SESSION SUMMARY
Course Elapsed Days: 9
Plan Fractions Treated to Date: 8
Plan Prescribed Dose Per Fraction: 2.66 Gy
Plan Total Fractions Prescribed: 16
Plan Total Prescribed Dose: 42.56 Gy
Reference Point Dosage Given to Date: 21.28 Gy
Reference Point Session Dosage Given: 2.66 Gy
Session Number: 8

## 2024-01-15 ENCOUNTER — Telehealth: Payer: Self-pay | Admitting: Adult Health

## 2024-01-15 ENCOUNTER — Ambulatory Visit
Admission: RE | Admit: 2024-01-15 | Discharge: 2024-01-15 | Disposition: A | Discharge status: Home / Self Care | Source: Ambulatory Visit | Attending: Radiation Oncology

## 2024-01-15 ENCOUNTER — Other Ambulatory Visit: Payer: Self-pay

## 2024-01-15 DIAGNOSIS — Z170 Estrogen receptor positive status [ER+]: Secondary | ICD-10-CM | POA: Diagnosis not present

## 2024-01-15 DIAGNOSIS — Z510 Encounter for antineoplastic radiation therapy: Secondary | ICD-10-CM | POA: Diagnosis not present

## 2024-01-15 DIAGNOSIS — C50212 Malignant neoplasm of upper-inner quadrant of left female breast: Secondary | ICD-10-CM | POA: Diagnosis not present

## 2024-01-15 LAB — RAD ONC ARIA SESSION SUMMARY
Course Elapsed Days: 12
Plan Fractions Treated to Date: 9
Plan Prescribed Dose Per Fraction: 2.66 Gy
Plan Total Fractions Prescribed: 16
Plan Total Prescribed Dose: 42.56 Gy
Reference Point Dosage Given to Date: 23.94 Gy
Reference Point Session Dosage Given: 2.66 Gy
Session Number: 9

## 2024-01-15 NOTE — Telephone Encounter (Signed)
 Called pt and is aware of upcoming appts In Jan

## 2024-01-16 ENCOUNTER — Ambulatory Visit

## 2024-01-17 ENCOUNTER — Other Ambulatory Visit: Payer: Self-pay

## 2024-01-17 ENCOUNTER — Ambulatory Visit
Admission: RE | Admit: 2024-01-17 | Discharge: 2024-01-17 | Disposition: A | Discharge status: Home / Self Care | Source: Ambulatory Visit | Attending: Radiation Oncology

## 2024-01-17 DIAGNOSIS — Z170 Estrogen receptor positive status [ER+]: Secondary | ICD-10-CM | POA: Diagnosis not present

## 2024-01-17 DIAGNOSIS — Z510 Encounter for antineoplastic radiation therapy: Secondary | ICD-10-CM | POA: Diagnosis not present

## 2024-01-17 DIAGNOSIS — C50212 Malignant neoplasm of upper-inner quadrant of left female breast: Secondary | ICD-10-CM | POA: Diagnosis not present

## 2024-01-17 LAB — RAD ONC ARIA SESSION SUMMARY
Course Elapsed Days: 14
Plan Fractions Treated to Date: 10
Plan Prescribed Dose Per Fraction: 2.66 Gy
Plan Total Fractions Prescribed: 16
Plan Total Prescribed Dose: 42.56 Gy
Reference Point Dosage Given to Date: 26.6 Gy
Reference Point Session Dosage Given: 2.66 Gy
Session Number: 10

## 2024-01-18 ENCOUNTER — Ambulatory Visit
Admission: RE | Admit: 2024-01-18 | Discharge: 2024-01-18 | Disposition: A | Discharge status: Home / Self Care | Source: Ambulatory Visit | Attending: Radiation Oncology | Admitting: Radiation Oncology

## 2024-01-18 ENCOUNTER — Ambulatory Visit: Admitting: Radiation Oncology

## 2024-01-18 ENCOUNTER — Other Ambulatory Visit: Payer: Self-pay

## 2024-01-18 DIAGNOSIS — Z510 Encounter for antineoplastic radiation therapy: Secondary | ICD-10-CM | POA: Diagnosis not present

## 2024-01-18 DIAGNOSIS — C50212 Malignant neoplasm of upper-inner quadrant of left female breast: Secondary | ICD-10-CM | POA: Diagnosis not present

## 2024-01-18 DIAGNOSIS — Z170 Estrogen receptor positive status [ER+]: Secondary | ICD-10-CM | POA: Diagnosis not present

## 2024-01-18 LAB — RAD ONC ARIA SESSION SUMMARY
Course Elapsed Days: 15
Plan Fractions Treated to Date: 11
Plan Prescribed Dose Per Fraction: 2.66 Gy
Plan Total Fractions Prescribed: 16
Plan Total Prescribed Dose: 42.56 Gy
Reference Point Dosage Given to Date: 29.26 Gy
Reference Point Session Dosage Given: 2.66 Gy
Session Number: 11

## 2024-01-19 ENCOUNTER — Other Ambulatory Visit: Payer: Self-pay

## 2024-01-19 ENCOUNTER — Ambulatory Visit
Admission: RE | Admit: 2024-01-19 | Discharge: 2024-01-19 | Disposition: A | Discharge status: Home / Self Care | Source: Ambulatory Visit | Attending: Radiation Oncology | Admitting: Radiation Oncology

## 2024-01-19 DIAGNOSIS — C50212 Malignant neoplasm of upper-inner quadrant of left female breast: Secondary | ICD-10-CM | POA: Diagnosis not present

## 2024-01-19 DIAGNOSIS — Z510 Encounter for antineoplastic radiation therapy: Secondary | ICD-10-CM | POA: Diagnosis not present

## 2024-01-19 DIAGNOSIS — Z170 Estrogen receptor positive status [ER+]: Secondary | ICD-10-CM | POA: Diagnosis not present

## 2024-01-19 LAB — RAD ONC ARIA SESSION SUMMARY
Course Elapsed Days: 16
Plan Fractions Treated to Date: 12
Plan Prescribed Dose Per Fraction: 2.66 Gy
Plan Total Fractions Prescribed: 16
Plan Total Prescribed Dose: 42.56 Gy
Reference Point Dosage Given to Date: 31.92 Gy
Reference Point Session Dosage Given: 2.66 Gy
Session Number: 12

## 2024-01-22 ENCOUNTER — Other Ambulatory Visit: Payer: Self-pay

## 2024-01-22 ENCOUNTER — Ambulatory Visit
Admission: RE | Admit: 2024-01-22 | Discharge: 2024-01-22 | Disposition: A | Discharge status: Home / Self Care | Source: Ambulatory Visit | Attending: Radiation Oncology | Admitting: Radiation Oncology

## 2024-01-22 DIAGNOSIS — C50212 Malignant neoplasm of upper-inner quadrant of left female breast: Secondary | ICD-10-CM | POA: Diagnosis not present

## 2024-01-22 DIAGNOSIS — Z170 Estrogen receptor positive status [ER+]: Secondary | ICD-10-CM | POA: Diagnosis not present

## 2024-01-22 DIAGNOSIS — Z510 Encounter for antineoplastic radiation therapy: Secondary | ICD-10-CM | POA: Diagnosis not present

## 2024-01-22 LAB — RAD ONC ARIA SESSION SUMMARY
Course Elapsed Days: 19
Plan Fractions Treated to Date: 13
Plan Prescribed Dose Per Fraction: 2.66 Gy
Plan Total Fractions Prescribed: 16
Plan Total Prescribed Dose: 42.56 Gy
Reference Point Dosage Given to Date: 34.58 Gy
Reference Point Session Dosage Given: 2.66 Gy
Session Number: 13

## 2024-01-23 ENCOUNTER — Ambulatory Visit: Admission: RE | Admit: 2024-01-23 | Source: Ambulatory Visit

## 2024-01-23 ENCOUNTER — Other Ambulatory Visit: Payer: Self-pay

## 2024-01-23 ENCOUNTER — Ambulatory Visit
Admission: RE | Admit: 2024-01-23 | Discharge: 2024-01-23 | Disposition: A | Source: Ambulatory Visit | Attending: Radiation Oncology

## 2024-01-23 DIAGNOSIS — Z510 Encounter for antineoplastic radiation therapy: Secondary | ICD-10-CM | POA: Diagnosis not present

## 2024-01-23 DIAGNOSIS — C50212 Malignant neoplasm of upper-inner quadrant of left female breast: Secondary | ICD-10-CM | POA: Diagnosis not present

## 2024-01-23 DIAGNOSIS — Z170 Estrogen receptor positive status [ER+]: Secondary | ICD-10-CM | POA: Diagnosis not present

## 2024-01-23 LAB — RAD ONC ARIA SESSION SUMMARY
Course Elapsed Days: 20
Plan Fractions Treated to Date: 14
Plan Prescribed Dose Per Fraction: 2.66 Gy
Plan Total Fractions Prescribed: 16
Plan Total Prescribed Dose: 42.56 Gy
Reference Point Dosage Given to Date: 37.24 Gy
Reference Point Session Dosage Given: 2.66 Gy
Session Number: 14

## 2024-01-24 ENCOUNTER — Other Ambulatory Visit: Payer: Self-pay

## 2024-01-24 ENCOUNTER — Ambulatory Visit
Admission: RE | Admit: 2024-01-24 | Discharge: 2024-01-24 | Disposition: A | Discharge status: Home / Self Care | Source: Ambulatory Visit | Attending: Radiation Oncology | Admitting: Radiation Oncology

## 2024-01-24 DIAGNOSIS — Z8049 Family history of malignant neoplasm of other genital organs: Secondary | ICD-10-CM | POA: Diagnosis not present

## 2024-01-24 DIAGNOSIS — C50212 Malignant neoplasm of upper-inner quadrant of left female breast: Secondary | ICD-10-CM | POA: Insufficient documentation

## 2024-01-24 DIAGNOSIS — Z510 Encounter for antineoplastic radiation therapy: Secondary | ICD-10-CM | POA: Diagnosis not present

## 2024-01-24 DIAGNOSIS — Z170 Estrogen receptor positive status [ER+]: Secondary | ICD-10-CM | POA: Insufficient documentation

## 2024-01-24 DIAGNOSIS — Z800 Family history of malignant neoplasm of digestive organs: Secondary | ICD-10-CM | POA: Diagnosis not present

## 2024-01-24 DIAGNOSIS — Z808 Family history of malignant neoplasm of other organs or systems: Secondary | ICD-10-CM | POA: Diagnosis not present

## 2024-01-24 LAB — RAD ONC ARIA SESSION SUMMARY
Course Elapsed Days: 21
Plan Fractions Treated to Date: 15
Plan Prescribed Dose Per Fraction: 2.66 Gy
Plan Total Fractions Prescribed: 16
Plan Total Prescribed Dose: 42.56 Gy
Reference Point Dosage Given to Date: 39.9 Gy
Reference Point Session Dosage Given: 2.66 Gy
Session Number: 15

## 2024-01-25 ENCOUNTER — Ambulatory Visit

## 2024-01-25 ENCOUNTER — Other Ambulatory Visit: Payer: Self-pay

## 2024-01-25 ENCOUNTER — Ambulatory Visit
Admission: RE | Admit: 2024-01-25 | Discharge: 2024-01-25 | Disposition: A | Source: Ambulatory Visit | Attending: Radiation Oncology

## 2024-01-25 DIAGNOSIS — Z170 Estrogen receptor positive status [ER+]: Secondary | ICD-10-CM | POA: Diagnosis not present

## 2024-01-25 DIAGNOSIS — C50212 Malignant neoplasm of upper-inner quadrant of left female breast: Secondary | ICD-10-CM | POA: Diagnosis not present

## 2024-01-25 LAB — RAD ONC ARIA SESSION SUMMARY
Course Elapsed Days: 22
Plan Fractions Treated to Date: 16
Plan Prescribed Dose Per Fraction: 2.66 Gy
Plan Total Fractions Prescribed: 16
Plan Total Prescribed Dose: 42.56 Gy
Reference Point Dosage Given to Date: 42.56 Gy
Reference Point Session Dosage Given: 2.66 Gy
Session Number: 16

## 2024-01-26 ENCOUNTER — Ambulatory Visit
Admission: RE | Admit: 2024-01-26 | Discharge: 2024-01-26 | Disposition: A | Discharge status: Home / Self Care | Source: Ambulatory Visit | Attending: Radiation Oncology | Admitting: Radiation Oncology

## 2024-01-26 ENCOUNTER — Other Ambulatory Visit: Payer: Self-pay

## 2024-01-26 ENCOUNTER — Ambulatory Visit

## 2024-01-26 DIAGNOSIS — Z8049 Family history of malignant neoplasm of other genital organs: Secondary | ICD-10-CM | POA: Diagnosis not present

## 2024-01-26 DIAGNOSIS — Z800 Family history of malignant neoplasm of digestive organs: Secondary | ICD-10-CM | POA: Diagnosis not present

## 2024-01-26 DIAGNOSIS — C50212 Malignant neoplasm of upper-inner quadrant of left female breast: Secondary | ICD-10-CM | POA: Diagnosis not present

## 2024-01-26 DIAGNOSIS — Z808 Family history of malignant neoplasm of other organs or systems: Secondary | ICD-10-CM | POA: Diagnosis not present

## 2024-01-26 LAB — RAD ONC ARIA SESSION SUMMARY
Course Elapsed Days: 23
Plan Fractions Treated to Date: 1
Plan Prescribed Dose Per Fraction: 2 Gy
Plan Total Fractions Prescribed: 4
Plan Total Prescribed Dose: 8 Gy
Reference Point Dosage Given to Date: 2 Gy
Reference Point Session Dosage Given: 2 Gy
Session Number: 17

## 2024-01-29 ENCOUNTER — Other Ambulatory Visit: Payer: Self-pay

## 2024-01-29 ENCOUNTER — Ambulatory Visit

## 2024-01-29 ENCOUNTER — Ambulatory Visit
Admission: RE | Admit: 2024-01-29 | Discharge: 2024-01-29 | Disposition: A | Discharge status: Home / Self Care | Source: Ambulatory Visit | Attending: Radiation Oncology | Admitting: Radiation Oncology

## 2024-01-29 DIAGNOSIS — Z170 Estrogen receptor positive status [ER+]: Secondary | ICD-10-CM | POA: Diagnosis not present

## 2024-01-29 DIAGNOSIS — Z808 Family history of malignant neoplasm of other organs or systems: Secondary | ICD-10-CM | POA: Diagnosis not present

## 2024-01-29 DIAGNOSIS — Z800 Family history of malignant neoplasm of digestive organs: Secondary | ICD-10-CM | POA: Diagnosis not present

## 2024-01-29 DIAGNOSIS — C50212 Malignant neoplasm of upper-inner quadrant of left female breast: Secondary | ICD-10-CM | POA: Diagnosis not present

## 2024-01-29 LAB — RAD ONC ARIA SESSION SUMMARY
Course Elapsed Days: 26
Plan Fractions Treated to Date: 2
Plan Prescribed Dose Per Fraction: 2 Gy
Plan Total Fractions Prescribed: 4
Plan Total Prescribed Dose: 8 Gy
Reference Point Dosage Given to Date: 4 Gy
Reference Point Session Dosage Given: 2 Gy
Session Number: 18

## 2024-01-30 ENCOUNTER — Other Ambulatory Visit: Payer: Self-pay

## 2024-01-30 ENCOUNTER — Ambulatory Visit

## 2024-01-30 ENCOUNTER — Ambulatory Visit
Admission: RE | Admit: 2024-01-30 | Discharge: 2024-01-30 | Disposition: A | Discharge status: Home / Self Care | Source: Ambulatory Visit | Attending: Radiation Oncology | Admitting: Radiation Oncology

## 2024-01-30 ENCOUNTER — Inpatient Hospital Stay (HOSPITAL_BASED_OUTPATIENT_CLINIC_OR_DEPARTMENT_OTHER): Admitting: Hematology

## 2024-01-30 VITALS — BP 118/72 | HR 78 | Temp 98.5°F | Resp 17 | Ht 66.0 in | Wt 102.8 lb

## 2024-01-30 DIAGNOSIS — C50212 Malignant neoplasm of upper-inner quadrant of left female breast: Secondary | ICD-10-CM | POA: Insufficient documentation

## 2024-01-30 DIAGNOSIS — Z808 Family history of malignant neoplasm of other organs or systems: Secondary | ICD-10-CM | POA: Diagnosis not present

## 2024-01-30 DIAGNOSIS — Z8049 Family history of malignant neoplasm of other genital organs: Secondary | ICD-10-CM | POA: Diagnosis not present

## 2024-01-30 DIAGNOSIS — Z800 Family history of malignant neoplasm of digestive organs: Secondary | ICD-10-CM | POA: Diagnosis not present

## 2024-01-30 DIAGNOSIS — Z170 Estrogen receptor positive status [ER+]: Secondary | ICD-10-CM | POA: Insufficient documentation

## 2024-01-30 LAB — RAD ONC ARIA SESSION SUMMARY
Course Elapsed Days: 27
Plan Fractions Treated to Date: 3
Plan Prescribed Dose Per Fraction: 2 Gy
Plan Total Fractions Prescribed: 4
Plan Total Prescribed Dose: 8 Gy
Reference Point Dosage Given to Date: 6 Gy
Reference Point Session Dosage Given: 2 Gy
Session Number: 19

## 2024-01-30 MED ORDER — TAMOXIFEN CITRATE 20 MG PO TABS: 20.0000 mg | ORAL_TABLET | Freq: Every day | ORAL | 2 refills | Status: DC

## 2024-01-30 NOTE — Progress Notes (Signed)
 [facility]   Telephone:([phone] Fax:(336) [phone]   Clinic Follow up Note   Patient Care Team: Pcp, No as PCP - General Tyree Nanetta SAILOR, RN as Oncology Nurse Navigator Ebbie Cough, MD as Consulting Physician (General Surgery) Lanny Callander, MD as Consulting Physician (Hematology) Dewey Rush, MD as Consulting Physician (Radiation Oncology)  Date of Service:  01/30/2024  CHIEF COMPLAINT: f/u of breast cancer  CURRENT THERAPY:  Adjuvant radiation  Oncology History   Malignant neoplasm of upper-inner quadrant of left breast in female, estrogen receptor positive (HCC) pT1bN0M0, stage IA, G2, ER+/PR+/HER2-, Oncotype RS 4 -diagnosed in 09/2023, s/p lumpectomy and adjuvant radiation  -I recommend adjuvant antiestrogen therapy    Assessment & Plan Malignant neoplasm of upper-inner quadrant of left breast Completing radiation therapy for a small tumor in the upper-inner quadrant of the left breast. The oncotype score is low risk with a recurrence score of 4, indicating a low risk of recurrence. The risk of distant recurrence is 3% with anti-estrogen therapy and 5% without.  Adjuvant chemotherapy is not recommended.   -Given her premenopausal status, tamoxifen is recommended as the anti-estrogen therapy. - Prescribe tamoxifen 20 mg daily, to start at the end of the month or early next month. - Discussed side effects of tamoxifen, including hot flashes, potential weight gain, mood swings, joint pain, risk of blood clots, and endometrial changes. The risk of endometrial cancer is low in premenopausal women. - Shared decision-making emphasized the low risk of recurrence and the benefits of tamoxifen in reducing metastatic disease risk. - Discussed the duration of therapy, typically 5 to 10 years, with studies showing 10 years being slightly more beneficial. - Monitor for side effects of tamoxifen, including hot flashes, mood swings, and joint pain. - Advise on  lifestyle modifications to manage hot flashes and potential weight gain. - Educate on the risk of blood clots and advise on preventive measures during prolonged immobility. - Schedule a survivorship clinic appointment in January with Manuelita. - Order a contrast-induced mammogram at the breast center for June. - Continue breast cancer surveillance with annual diagnostic mammograms for the next two years.   Plan - I reviewed her surgical path, Oncotype results - She will complete adjuvant radiation tomorrow - I recommend adjuvant tamoxifen for 5 to 10 years, prescription called in today, she will start in 3 to 4 weeks. - Survivorship in 3 months and follow-up with me in 6 months.  SUMMARY OF ONCOLOGIC HISTORY: Oncology History  Malignant neoplasm of upper-inner quadrant of left breast in female, estrogen receptor positive (HCC)  10/11/2023 Cancer Staging   Staging form: Breast, AJCC 8th Edition - Clinical stage from 10/11/2023: Stage IA (cT1b, cN0, cM0, G2, ER+, PR+, HER2-) - Signed by Lanny Callander, MD on 10/18/2023 Stage prefix: Initial diagnosis Histologic grading system: 3 grade system   10/17/2023 Initial Diagnosis   Malignant neoplasm of upper-inner quadrant of left breast in female, estrogen receptor positive (HCC)   10/29/2023 Genetic Testing   Negative genetic testing on the CancerNext-Expanded+RNAinsight panel.  The report date is October 29, 2023.  The CancerNext-Expanded gene panel offered by [facility] and includes sequencing, rearrangement, and RNA analysis for the following 77 genes: AIP, ALK, APC, ATM, BAP1, BARD1, BMPR1A, BRCA1, BRCA2, BRIP1, CDC73, CDH1, CDK4, CDKN1B, CDKN2A, CEBPA, CHEK2, CTNNA1, DDX41, DICER1, ETV6, FH, FLCN, GATA2, LZTR1, MAX, MBD4, MEN1, MET, MLH1, MSH2, MSH3, MSH6, MUTYH, NF1, NF2, NTHL1, PALB2, PHOX2B, PMS2, POT1, PRKAR1A, PTCH1, PTEN, RAD51C, RAD51D, RB1, RET, RPS20, RUNX1, SDHA,  SDHAF2, SDHB, SDHC, SDHD, SMAD4, SMARCA4, SMARCB1, SMARCE1, STK11, SUFU,  TMEM127, TP53, TSC1, TSC2, VHL, and WT1 (sequencing and deletion/duplication); AXIN2, CTNNA1, DDX41, EGFR, HOXB13, KIT, MBD4, MITF, MSH3, PDGFRA, POLD1 and POLE (sequencing only); EPCAM and GREM1 (deletion/duplication only). RNA data is routinely analyzed for use in variant interpretation for all genes.    12/01/2023 Cancer Staging   Staging form: Breast, AJCC 8th Edition - Pathologic stage from 12/01/2023: Stage IA (pT1b, pN0, cM0, G2, ER+, PR+, HER2-) - Signed by Lanny Callander, MD on 01/30/2024 Stage prefix: Initial diagnosis Histologic grading system: 3 grade system Residual tumor (R): R0      Discussed the use of AI scribe software for clinical note transcription with the patient, who gave verbal consent to proceed.  History of Present Illness Sheryl [name] is a 48 year old female with breast cancer who presents for follow-up after radiation therapy.  She is nearing the completion of her radiation therapy, with the last dose scheduled for tomorrow. She has experienced soreness and redness of the skin, but no peeling. Her energy levels remain good without significant fatigue.  Surgery on July 23rd revealed a one-centimeter, grade two tumor. The oncotype score was low risk, with a recurrence score of four.  She is considering tamoxifen therapy due to her premenopausal status, as she still has regular menstrual periods. She is concerned about potential hair loss, as she already feels like she is losing a lot of hair.     All other systems were reviewed with the patient and are negative.  MEDICAL HISTORY:  Past Medical History:  Diagnosis Date   Breast cancer (HCC) 10/2023   Left Breast   Family history of brain cancer    Family history of pancreatic cancer    Family history of prostate cancer    Family history of uterine cancer     SURGICAL HISTORY: Past Surgical History:  Procedure Laterality Date   AXILLARY SENTINEL NODE BIOPSY Left 11/15/2023   Procedure: BIOPSY, LYMPH NODE,  SENTINEL, AXILLARY;  Surgeon: Ebbie Cough, MD;  Location: [facility];  Service: General;  Laterality: Left;   BREAST BIOPSY Left 10/11/2023   US  LT BREAST BX W LOC DEV 1ST LESION IMG BX SPEC US  GUIDE 10/11/2023 [facility] MAMMOGRAPHY   BREAST BIOPSY  11/14/2023   MM LT RADIOACTIVE SEED LOC MAMMO GUIDE 11/14/2023 [facility] MAMMOGRAPHY   BREAST LUMPECTOMY WITH RADIOACTIVE SEED LOCALIZATION Left 11/15/2023   Procedure: BREAST LUMPECTOMY WITH RADIOACTIVE SEED LOCALIZATION;  Surgeon: Ebbie Cough, MD;  Location:  SURGERY CENTER;  Service: General;  Laterality: Left;   MENISCUS REPAIR Right     I have reviewed the social history and family history with the patient and they are unchanged from previous note.  ALLERGIES:  has no known allergies.  MEDICATIONS:  Current Outpatient Medications  Medication Sig Dispense Refill   tamoxifen (NOLVADEX) 20 MG tablet Take 1 tablet (20 mg total) by mouth daily. 30 tablet 2   No current facility-administered medications for this visit.    PHYSICAL EXAMINATION: ECOG PERFORMANCE STATUS: 0 - Asymptomatic  Vitals:   01/30/24 1337  BP: 118/72  Pulse: 78  Resp: 17  Temp: 98.5 F (36.9 C)  SpO2: 99%   Wt Readings from Last 3 Encounters:  01/30/24 102 lb 12.8 oz (46.6 kg)  12/21/23 105 lb (47.6 kg)  11/15/23 102 lb 15.3 oz (46.7 kg)     GENERAL:alert, no distress and comfortable SKIN: skin color, texture, turgor are normal, no rashes or  significant lesions EYES: normal, Conjunctiva are pink and non-injected, sclera clear Musculoskeletal:no cyanosis of digits and no clubbing  NEURO: alert & oriented x 3 with fluent speech, no focal motor/sensory deficits  Physical Exam   LABORATORY DATA:  I have reviewed the data as listed    Latest Ref Rng & Units 10/31/2023   10:23 AM 10/18/2023   12:34 PM 11/08/2020    8:15 PM  CBC  WBC 4.0 - 10.5 K/uL 3.8  3.9  12.8   Hemoglobin 12.0 - 15.0 g/dL 85.8  87.1  86.5   Hematocrit  36.0 - 46.0 % 42.4  37.7  41.5   Platelets 150 - 400 K/uL 193  166  208         Latest Ref Rng & Units 10/31/2023   10:23 AM 10/18/2023   12:34 PM 11/08/2020    8:15 PM  CMP  Glucose 70 - 99 mg/dL 79  878  881   BUN 6 - 20 mg/dL 10  9  13    Creatinine 0.44 - 1.00 mg/dL 9.27  9.22  9.23   Sodium 135 - 145 mmol/L 140  139  140   Potassium 3.5 - 5.1 mmol/L 4.0  3.7  4.0   Chloride 98 - 111 mmol/L 105  106  107   CO2 22 - 32 mmol/L 31  28  26    Calcium 8.9 - 10.3 mg/dL 9.7  9.2  9.3   Total Protein 6.5 - 8.1 g/dL 6.9  6.7  7.3   Total Bilirubin 0.0 - 1.2 mg/dL 0.4  0.4  0.5   Alkaline Phos 38 - 126 U/L 35  32  36   AST 15 - 41 U/L 14  12  17    ALT 0 - 44 U/L 12  11  16        RADIOGRAPHIC STUDIES: I have personally reviewed the radiological images as listed and agreed with the findings in the report. No results found.    No orders of the defined types were placed in this encounter.  All questions were answered. The patient knows to call the clinic with any problems, questions or concerns. No barriers to learning was detected. The total time spent in the appointment was 30 minutes, including review of chart and various tests results, discussions about plan of care and coordination of care plan     Onita Mattock, MD 01/30/2024

## 2024-01-30 NOTE — Assessment & Plan Note (Signed)
 pT1bN0M0, stage IA, G2, ER+/PR+/HER2-, Oncotype RS 4 -diagnosed in 09/2023, s/p lumpectomy and adjuvant radiation  -I recommend adjuvant antiestrogen therapy

## 2024-01-31 ENCOUNTER — Ambulatory Visit
Admission: RE | Admit: 2024-01-31 | Discharge: 2024-01-31 | Disposition: A | Discharge status: Home / Self Care | Source: Ambulatory Visit | Attending: Radiation Oncology | Admitting: Radiation Oncology

## 2024-01-31 ENCOUNTER — Ambulatory Visit

## 2024-01-31 ENCOUNTER — Other Ambulatory Visit: Payer: Self-pay

## 2024-01-31 DIAGNOSIS — Z510 Encounter for antineoplastic radiation therapy: Secondary | ICD-10-CM | POA: Diagnosis not present

## 2024-01-31 DIAGNOSIS — C50212 Malignant neoplasm of upper-inner quadrant of left female breast: Secondary | ICD-10-CM | POA: Diagnosis not present

## 2024-01-31 DIAGNOSIS — Z808 Family history of malignant neoplasm of other organs or systems: Secondary | ICD-10-CM | POA: Diagnosis not present

## 2024-01-31 DIAGNOSIS — Z8049 Family history of malignant neoplasm of other genital organs: Secondary | ICD-10-CM | POA: Diagnosis not present

## 2024-01-31 DIAGNOSIS — Z170 Estrogen receptor positive status [ER+]: Secondary | ICD-10-CM | POA: Diagnosis not present

## 2024-01-31 DIAGNOSIS — Z800 Family history of malignant neoplasm of digestive organs: Secondary | ICD-10-CM | POA: Diagnosis not present

## 2024-01-31 LAB — RAD ONC ARIA SESSION SUMMARY
Course Elapsed Days: 28
Plan Fractions Treated to Date: 4
Plan Prescribed Dose Per Fraction: 2 Gy
Plan Total Fractions Prescribed: 4
Plan Total Prescribed Dose: 8 Gy
Reference Point Dosage Given to Date: 8 Gy
Reference Point Session Dosage Given: 2 Gy
Session Number: 20

## 2024-02-01 ENCOUNTER — Ambulatory Visit

## 2024-02-01 NOTE — Radiation Completion Notes (Addendum)
  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   Intent: Curative     ==========DELIVERED PLANS==========  First Treatment Date: 2024-01-03 Last Treatment Date: 2024-01-31   Plan Name: Breast_L_BH Site: Breast, Left Technique: 3D Mode: Photon Dose Per Fraction: 2.66 Gy Prescribed Dose (Delivered / Prescribed): 42.56 Gy / 42.56 Gy Prescribed Fxs (Delivered / Prescribed): 16 / 16   Plan Name: Breast_L_Bst Site: Breast, Left Technique: Electron Mode: Electron Dose Per Fraction: 2 Gy Prescribed Dose (Delivered / Prescribed): 8 Gy / 8 Gy Prescribed Fxs (Delivered / Prescribed): 4 / 4     ==========ON TREATMENT VISIT DATES========== 2024-01-05, 2024-01-12, 2024-01-18, 2024-01-26    See weekly On Treatment Notes in Epic for details in the Media tab (listed as Progress notes on the On Treatment Visit Dates listed above). The patient tolerated radiation. She developed fatigue and anticipated skin changes in the treatment field.   The patient will receive a call in about one month from the radiation oncology department. She will continue follow up with Dr. Lanny as well.      Donald KYM Husband, PAC

## 2024-02-19 NOTE — Progress Notes (Signed)
  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   The patient has scheduled follow up with her medical oncologist Dr. Lanny for ongoing surveillance, and was encouraged to call if she develops concerns or questions regarding radiation.

## 2024-03-03 ENCOUNTER — Encounter: Payer: Self-pay | Admitting: Hematology

## 2024-03-04 ENCOUNTER — Ambulatory Visit: Attending: General Surgery

## 2024-03-04 ENCOUNTER — Other Ambulatory Visit: Payer: Self-pay

## 2024-03-04 ENCOUNTER — Ambulatory Visit
Admission: RE | Admit: 2024-03-04 | Discharge: 2024-03-04 | Disposition: A | Discharge status: Home / Self Care | Source: Ambulatory Visit | Attending: Radiation Oncology | Admitting: Radiation Oncology

## 2024-03-04 VITALS — Wt 104.1 lb

## 2024-03-04 DIAGNOSIS — C50212 Malignant neoplasm of upper-inner quadrant of left female breast: Secondary | ICD-10-CM

## 2024-03-04 DIAGNOSIS — Z483 Aftercare following surgery for neoplasm: Secondary | ICD-10-CM | POA: Insufficient documentation

## 2024-03-04 NOTE — Therapy (Signed)
 OUTPATIENT PHYSICAL THERAPY SOZO SCREENING NOTE   Patient Name: Sheryl [name] MRN: [id number] DOB:[birth year], 48 y.o., female Today's Date: 03/04/2024  PCP: Freddrick, No REFERRING PROVIDER: Ebbie Cough, MD   PT End of Session - 03/04/24 1536     Visit Number 3   # unchanged due to screen only   PT Start Time 1533    PT Stop Time 1539    PT Time Calculation (min) 6 min    Activity Tolerance Patient tolerated treatment well    Behavior During Therapy [facility] for tasks assessed/performed          Past Medical History:  Diagnosis Date   Breast cancer (HCC) 10/2023   Left Breast   Family history of brain cancer    Family history of pancreatic cancer    Family history of prostate cancer    Family history of uterine cancer    Past Surgical History:  Procedure Laterality Date   AXILLARY SENTINEL NODE BIOPSY Left 11/15/2023   Procedure: BIOPSY, LYMPH NODE, SENTINEL, AXILLARY;  Surgeon: Ebbie Cough, MD;  Location: [facility];  Service: General;  Laterality: Left;   BREAST BIOPSY Left 10/11/2023   US  LT BREAST BX W LOC DEV 1ST LESION IMG BX SPEC US  GUIDE 10/11/2023 [facility] MAMMOGRAPHY   BREAST BIOPSY  11/14/2023   MM LT RADIOACTIVE SEED LOC MAMMO GUIDE 11/14/2023 [facility] MAMMOGRAPHY   BREAST LUMPECTOMY WITH RADIOACTIVE SEED LOCALIZATION Left 11/15/2023   Procedure: BREAST LUMPECTOMY WITH RADIOACTIVE SEED LOCALIZATION;  Surgeon: Ebbie Cough, MD;  Location: [facility];  Service: General;  Laterality: Left;   MENISCUS REPAIR Right    Patient Active Problem List   Diagnosis Date Noted   Genetic testing 10/30/2023   Family history of pancreatic cancer    Family history of prostate cancer    Family history of uterine cancer    Family history of brain cancer    Malignant neoplasm of upper-inner quadrant of left breast in female, estrogen receptor positive (HCC) 10/17/2023    REFERRING DIAG: left breast cancer at risk for  lymphedema  THERAPY DIAG: Aftercare following surgery for neoplasm  PERTINENT HISTORY: Patient was diagnosed on 10/09/23 with left grade 2 IDC with DCIS. It measures 1 cm and is located in the upper-inner quadrant. It is ER/PR positive, HER2 neg with a Ki67 of 1%. lumpectomy with SLNB 11/15/23 with 4 negative nodes removed.  Will have radiation.   PRECAUTIONS: left UE Lymphedema risk, None  SUBJECTIVE: Pt returns for her first 3 month L-Dex screen.   PAIN:  Are you having pain? No  SOZO SCREENING: Patient was assessed today using the SOZO machine to determine the lymphedema index score. This was compared to her baseline score. It was determined that she is within the recommended range when compared to her baseline and no further action is needed at this time. She will continue SOZO screenings. These are done every 3 months for 2 years post operatively followed by every 6 months for 2 years, and then annually.   L-DEX FLOWSHEETS - 03/04/24 1500       L-DEX LYMPHEDEMA SCREENING   Measurement Type Unilateral    L-DEX MEASUREMENT EXTREMITY Upper Extremity    POSITION  Standing    DOMINANT SIDE Right    At Risk Side Left    BASELINE SCORE (UNILATERAL) -1    L-DEX SCORE (UNILATERAL) -1.5    VALUE CHANGE (UNILAT) -0.5            Aden,  Berwyn Caldron, PTA 03/04/2024, 3:41 PM

## 2024-03-06 ENCOUNTER — Other Ambulatory Visit: Payer: Self-pay | Admitting: Nurse Practitioner

## 2024-03-06 ENCOUNTER — Telehealth: Payer: Self-pay | Admitting: *Deleted

## 2024-03-06 ENCOUNTER — Encounter: Payer: Self-pay | Admitting: Nurse Practitioner

## 2024-03-06 MED ORDER — METHYLPREDNISOLONE 4 MG PO TBPK: ORAL_TABLET | ORAL | 0 refills | Status: DC

## 2024-03-06 MED ORDER — TAMOXIFEN CITRATE 10 MG PO TABS: 10.0000 mg | ORAL_TABLET | Freq: Every day | ORAL | 0 refills | Status: DC

## 2024-03-06 NOTE — Telephone Encounter (Signed)
 Sheryl [name] called to inquire if she should continue/stop her Claritin, Benadryl and Pepcid while on the steroid pack Lacie, NP ordered for her today. Informed patient that Lacie is not in office now, but will send inquiry to Dr. Lanny.

## 2024-03-06 NOTE — Progress Notes (Signed)
 Called [name] in response to MyChart message re: rash with Tamoxifen. She has stopped Tamoxifen and started Benadryl/pepcid. Still has diffuse rash on her torso and arms, but not itching. No other associated sx such as throat tightness, difficulty breathing, etc.   I sent in Rx medrol dose pack for her to complete. Once she recovers she agrees to try Tamoxifen 10 mg from another manufacturer. OK to start after Thanksgiving/~12/1. I will call her a week later to check in.   She knows to call sooner if symptoms of reaction worsen or do not resolve or she has other concerns. No other needs at this time and she appreciated the call.   Sheryl Metzger, NP

## 2024-03-08 NOTE — Telephone Encounter (Signed)
 Notified Sheryl [name] that Dr. Lanny said to continue pepcid, benadryl and claritin till rashes almost resolved.

## 2024-03-20 ENCOUNTER — Telehealth: Payer: Self-pay | Admitting: Adult Health

## 2024-03-20 NOTE — Telephone Encounter (Signed)
 Patient called in stating she needs to reschedule her Survivorship appointment with Morna to December instead of having it in January.

## 2024-03-28 ENCOUNTER — Other Ambulatory Visit: Payer: Self-pay | Admitting: Nurse Practitioner

## 2024-04-01 NOTE — Progress Notes (Unsigned)
 [facility]   Telephone:([phone] Fax:(336) [phone]    Patient Care Team: Pcp, No as PCP - General Tyree Nanetta SAILOR, RN as Oncology Nurse Navigator Ebbie Cough, MD as Consulting Physician (General Surgery) Lanny Callander, MD as Consulting Physician (Hematology) Dewey Rush, MD as Consulting Physician (Radiation Oncology)   CHIEF COMPLAINT: Follow up rash  Oncology History  Malignant neoplasm of upper-inner quadrant of left breast in female, estrogen receptor positive (HCC)  10/11/2023 Cancer Staging   Staging form: Breast, AJCC 8th Edition - Clinical stage from 10/11/2023: Stage IA (cT1b, cN0, cM0, G2, ER+, PR+, HER2-) - Signed by Lanny Callander, MD on 10/18/2023 Stage prefix: Initial diagnosis Histologic grading system: 3 grade system   10/17/2023 Initial Diagnosis   Malignant neoplasm of upper-inner quadrant of left breast in female, estrogen receptor positive (HCC)   10/29/2023 Genetic Testing   Negative genetic testing on the CancerNext-Expanded+RNAinsight panel.  The report date is October 29, 2023.  The CancerNext-Expanded gene panel offered by [facility] and includes sequencing, rearrangement, and RNA analysis for the following 77 genes: AIP, ALK, APC, ATM, BAP1, BARD1, BMPR1A, BRCA1, BRCA2, BRIP1, CDC73, CDH1, CDK4, CDKN1B, CDKN2A, CEBPA, CHEK2, CTNNA1, DDX41, DICER1, ETV6, FH, FLCN, GATA2, LZTR1, MAX, MBD4, MEN1, MET, MLH1, MSH2, MSH3, MSH6, MUTYH, NF1, NF2, NTHL1, PALB2, PHOX2B, PMS2, POT1, PRKAR1A, PTCH1, PTEN, RAD51C, RAD51D, RB1, RET, RPS20, RUNX1, SDHA, SDHAF2, SDHB, SDHC, SDHD, SMAD4, SMARCA4, SMARCB1, SMARCE1, STK11, SUFU, TMEM127, TP53, TSC1, TSC2, VHL, and WT1 (sequencing and deletion/duplication); AXIN2, CTNNA1, DDX41, EGFR, HOXB13, KIT, MBD4, MITF, MSH3, PDGFRA, POLD1 and POLE (sequencing only); EPCAM and GREM1 (deletion/duplication only). RNA data is routinely analyzed for use in variant interpretation for all genes.    12/01/2023 Cancer Staging    Staging form: Breast, AJCC 8th Edition - Pathologic stage from 12/01/2023: Stage IA (pT1b, pN0, cM0, G2, ER+, PR+, HER2-) - Signed by Lanny Callander, MD on 01/30/2024 Stage prefix: Initial diagnosis Histologic grading system: 3 grade system Residual tumor (R): R0      CURRENT THERAPY:   INTERVAL HISTORY   ROS   Past Medical History:  Diagnosis Date   Breast cancer (HCC) 10/2023   Left Breast   Family history of brain cancer    Family history of pancreatic cancer    Family history of prostate cancer    Family history of uterine cancer      Past Surgical History:  Procedure Laterality Date   AXILLARY SENTINEL NODE BIOPSY Left 11/15/2023   Procedure: BIOPSY, LYMPH NODE, SENTINEL, AXILLARY;  Surgeon: Ebbie Cough, MD;  Location: [facility];  Service: General;  Laterality: Left;   BREAST BIOPSY Left 10/11/2023   US  LT BREAST BX W LOC DEV 1ST LESION IMG BX SPEC US  GUIDE 10/11/2023 [facility]AMMOGRAPHY   BREAST BIOPSY  11/14/2023   MM LT RADIOACTIVE SEED LOC MAMMO GUIDE 11/14/2023 [facility] MAMMOGRAPHY   BREAST LUMPECTOMY WITH RADIOACTIVE SEED LOCALIZATION Left 11/15/2023   Procedure: BREAST LUMPECTOMY WITH RADIOACTIVE SEED LOCALIZATION;  Surgeon: Ebbie Cough, MD;  Location: [facility];  Service: General;  Laterality: Left;   MENISCUS REPAIR Right      Outpatient Encounter Medications as of 04/02/2024  Medication Sig   methylPREDNISolone  (MEDROL  DOSEPAK) 4 MG TBPK tablet Take as prescribed per package insert   tamoxifen  (NOLVADEX ) 10 MG tablet TAKE 1 TABLET BY MOUTH EVERY DAY   No facility-administered encounter medications on file as of 04/02/2024.     There were no vitals filed  for this visit. There is no height or weight on file to calculate BMI.   ECOG PERFORMANCE STATUS: {CHL ONC ECOG [id number]}  PHYSICAL EXAM GENERAL:alert, no distress and comfortable SKIN: no rash  EYES: sclera clear NECK: without mass LYMPH:  no palpable cervical or  supraclavicular lymphadenopathy  LUNGS: clear with normal breathing effort HEART: regular rate & rhythm, no lower extremity edema ABDOMEN: abdomen soft, non-tender and normal bowel sounds NEURO: alert & oriented x 3 with fluent speech, no focal motor/sensory deficits Breast exam:  PAC without erythema    CBC    Latest Ref Rng & Units 10/31/2023   10:23 AM 10/18/2023   12:34 PM 11/08/2020    8:15 PM  CBC  WBC 4.0 - 10.5 K/uL 3.8  3.9  12.8   Hemoglobin 12.0 - 15.0 g/dL 85.8  87.1  86.5   Hematocrit 36.0 - 46.0 % 42.4  37.7  41.5   Platelets 150 - 400 K/uL 193  166  208       CMP     Latest Ref Rng & Units 10/31/2023   10:23 AM 10/18/2023   12:34 PM 11/08/2020    8:15 PM  CMP  Glucose 70 - 99 mg/dL 79  878  881   BUN 6 - 20 mg/dL 10  9  13    Creatinine 0.44 - 1.00 mg/dL 9.27  9.22  9.23   Sodium 135 - 145 mmol/L 140  139  140   Potassium 3.5 - 5.1 mmol/L 4.0  3.7  4.0   Chloride 98 - 111 mmol/L 105  106  107   CO2 22 - 32 mmol/L 31  28  26    Calcium 8.9 - 10.3 mg/dL 9.7  9.2  9.3   Total Protein 6.5 - 8.1 g/dL 6.9  6.7  7.3   Total Bilirubin 0.0 - 1.2 mg/dL 0.4  0.4  0.5   Alkaline Phos 38 - 126 U/L 35  32  36   AST 15 - 41 U/L 14  12  17    ALT 0 - 44 U/L 12  11  16        ASSESSMENT & PLAN:  PLAN:  No orders of the defined types were placed in this encounter.     All questions were answered. The patient knows to call the clinic with any problems, questions or concerns. No barriers to learning were detected. I spent *** counseling the patient face to face. The total time spent in the appointment was *** and more than 50% was on counseling, review of test results, and coordination of care.   Pietra Zuluaga K Tryphena Perkovich, NP 04/01/2024 9:29 PM

## 2024-04-02 ENCOUNTER — Inpatient Hospital Stay: Attending: Radiation Oncology | Admitting: Nurse Practitioner

## 2024-04-02 ENCOUNTER — Encounter: Payer: Self-pay | Admitting: Nurse Practitioner

## 2024-04-02 VITALS — BP 106/60 | HR 68 | Temp 97.8°F | Resp 17 | Wt 104.7 lb

## 2024-04-02 DIAGNOSIS — Z808 Family history of malignant neoplasm of other organs or systems: Secondary | ICD-10-CM | POA: Insufficient documentation

## 2024-04-02 DIAGNOSIS — Z800 Family history of malignant neoplasm of digestive organs: Secondary | ICD-10-CM | POA: Diagnosis not present

## 2024-04-02 DIAGNOSIS — Z79810 Long term (current) use of selective estrogen receptor modulators (SERMs): Secondary | ICD-10-CM | POA: Insufficient documentation

## 2024-04-02 DIAGNOSIS — Z923 Personal history of irradiation: Secondary | ICD-10-CM | POA: Insufficient documentation

## 2024-04-02 DIAGNOSIS — C50212 Malignant neoplasm of upper-inner quadrant of left female breast: Secondary | ICD-10-CM | POA: Insufficient documentation

## 2024-04-02 DIAGNOSIS — Z170 Estrogen receptor positive status [ER+]: Secondary | ICD-10-CM | POA: Insufficient documentation

## 2024-04-02 DIAGNOSIS — Z8049 Family history of malignant neoplasm of other genital organs: Secondary | ICD-10-CM | POA: Diagnosis not present

## 2024-04-02 MED ORDER — TAMOXIFEN CITRATE 10 MG PO TABS: 10.0000 mg | ORAL_TABLET | Freq: Every day | ORAL | 3 refills | Status: AC

## 2024-04-05 ENCOUNTER — Encounter: Payer: Self-pay | Admitting: *Deleted

## 2024-04-11 ENCOUNTER — Inpatient Hospital Stay

## 2024-04-11 ENCOUNTER — Inpatient Hospital Stay: Admitting: Adult Health

## 2024-04-11 ENCOUNTER — Encounter: Payer: Self-pay | Admitting: Adult Health

## 2024-04-11 VITALS — BP 106/48 | HR 71 | Temp 98.1°F | Resp 16 | Ht 66.0 in | Wt 105.2 lb

## 2024-04-11 DIAGNOSIS — Z170 Estrogen receptor positive status [ER+]: Secondary | ICD-10-CM | POA: Diagnosis not present

## 2024-04-11 DIAGNOSIS — Z9189 Other specified personal risk factors, not elsewhere classified: Secondary | ICD-10-CM | POA: Diagnosis not present

## 2024-04-11 DIAGNOSIS — C50212 Malignant neoplasm of upper-inner quadrant of left female breast: Secondary | ICD-10-CM

## 2024-04-11 LAB — CMP (CANCER CENTER ONLY)
ALT: 13 U/L (ref 0–44)
AST: 20 U/L (ref 15–41)
Albumin: 4.6 g/dL (ref 3.5–5.0)
Alkaline Phosphatase: 37 U/L — ABNORMAL LOW (ref 38–126)
Anion gap: 9 (ref 5–15)
BUN: 8 mg/dL (ref 6–20)
CO2: 27 mmol/L (ref 22–32)
Calcium: 9.3 mg/dL (ref 8.9–10.3)
Chloride: 105 mmol/L (ref 98–111)
Creatinine: 0.71 mg/dL (ref 0.44–1.00)
GFR, Estimated: >60 mL/min (ref >60)
Glucose, Bld: 94 mg/dL (ref 70–99)
Potassium: 4.5 mmol/L (ref 3.5–5.1)
Sodium: 140 mmol/L (ref 135–145)
Total Bilirubin: 0.4 mg/dL (ref 0.0–1.2)
Total Protein: 7.3 g/dL (ref 6.5–8.1)

## 2024-04-11 LAB — CBC WITH DIFFERENTIAL (CANCER CENTER ONLY)
Abs Immature Granulocytes: 0.01 K/uL (ref 0.00–0.07)
Basophils Absolute: 0 K/uL (ref 0.0–0.1)
Basophils Relative: 1 %
Eosinophils Absolute: 0 K/uL (ref 0.0–0.5)
Eosinophils Relative: 1 %
HCT: 42.3 % (ref 36.0–46.0)
Hemoglobin: 13.9 g/dL (ref 12.0–15.0)
Immature Granulocytes: 0 %
Lymphocytes Relative: 20 %
Lymphs Abs: 0.6 K/uL — ABNORMAL LOW (ref 0.7–4.0)
MCH: 30.7 pg (ref 26.0–34.0)
MCHC: 32.9 g/dL (ref 30.0–36.0)
MCV: 93.4 fL (ref 80.0–100.0)
Monocytes Absolute: 0.3 K/uL (ref 0.1–1.0)
Monocytes Relative: 8 %
Neutro Abs: 2.3 K/uL (ref 1.7–7.7)
Neutrophils Relative %: 70 %
Platelet Count: 169 K/uL (ref 150–400)
RBC: 4.53 MIL/uL (ref 3.87–5.11)
RDW: 13.1 % (ref 11.5–15.5)
WBC Count: 3.3 K/uL — ABNORMAL LOW (ref 4.0–10.5)
nRBC: 0 % (ref 0.0–0.2)

## 2024-04-11 NOTE — Progress Notes (Unsigned)
 SURVIVORSHIP VISIT:  BRIEF ONCOLOGIC HISTORY:  Oncology History  Malignant neoplasm of upper-inner quadrant of left breast in female, estrogen receptor positive (HCC)  10/11/2023 Cancer Staging   Staging form: Breast, AJCC 8th Edition - Clinical stage from 10/11/2023: Stage IA (cT1b, cN0, cM0, G2, ER+, PR+, HER2-) - Signed by Lanny Callander, MD on 10/18/2023 Stage prefix: Initial diagnosis Histologic grading system: 3 grade system   10/17/2023 Initial Diagnosis   Malignant neoplasm of upper-inner quadrant of left breast in female, estrogen receptor positive (HCC)   10/29/2023 Genetic Testing   Negative genetic testing on the CancerNext-Expanded+RNAinsight panel.  The report date is October 29, 2023.  The CancerNext-Expanded gene panel offered by [facility] and includes sequencing, rearrangement, and RNA analysis for the following 77 genes: AIP, ALK, APC, ATM, BAP1, BARD1, BMPR1A, BRCA1, BRCA2, BRIP1, CDC73, CDH1, CDK4, CDKN1B, CDKN2A, CEBPA, CHEK2, CTNNA1, DDX41, DICER1, ETV6, FH, FLCN, GATA2, LZTR1, MAX, MBD4, MEN1, MET, MLH1, MSH2, MSH3, MSH6, MUTYH, NF1, NF2, NTHL1, PALB2, PHOX2B, PMS2, POT1, PRKAR1A, PTCH1, PTEN, RAD51C, RAD51D, RB1, RET, RPS20, RUNX1, SDHA, SDHAF2, SDHB, SDHC, SDHD, SMAD4, SMARCA4, SMARCB1, SMARCE1, STK11, SUFU, TMEM127, TP53, TSC1, TSC2, VHL, and WT1 (sequencing and deletion/duplication); AXIN2, CTNNA1, DDX41, EGFR, HOXB13, KIT, MBD4, MITF, MSH3, PDGFRA, POLD1 and POLE (sequencing only); EPCAM and GREM1 (deletion/duplication only). RNA data is routinely analyzed for use in variant interpretation for all genes.    12/01/2023 Cancer Staging   Staging form: Breast, AJCC 8th Edition - Pathologic stage from 12/01/2023: Stage IA (pT1b, pN0, cM0, G2, ER+, PR+, HER2-) - Signed by Lanny Callander, MD on 01/30/2024 Stage prefix: Initial diagnosis Histologic grading system: 3 grade system Residual tumor (R): R0   01/03/2024 - 01/31/2024 Radiation Therapy   Plan Name: Breast_L_BH Site: Breast,  Left Technique: 3D Mode: Photon Dose Per Fraction: 2.66 Gy Prescribed Dose (Delivered / Prescribed): 42.56 Gy / 42.56 Gy Prescribed Fxs (Delivered / Prescribed): 16 / 16   Plan Name: Breast_L_Bst Site: Breast, Left Technique: Electron Mode: Electron Dose Per Fraction: 2 Gy Prescribed Dose (Delivered / Prescribed): 8 Gy / 8 Gy Prescribed Fxs (Delivered / Prescribed): 4 / 4   01/2024 -  Anti-estrogen oral therapy   10 mg Tamoxifen  daily     INTERVAL HISTORY:  Discussed the use of AI scribe software for clinical note transcription with the patient, who gave verbal consent to proceed.  History of Present Illness Sheryl [name] is a 48 year old female with stage 1A estrogen receptor positive left breast cancer presenting for oncology follow-up and management of tamoxifen  dosing after prior allergic reaction.  She was diagnosed with stage 1A estrogen receptor positive left breast cancer prior to age 54 with a low Oncotype score of 4 and negative genetic testing. She underwent lumpectomy with four negative lymph nodes and adjuvant radiation and is on adjuvant tamoxifen .  She began tamoxifen  20 mg daily and developed facial swelling, perioral urticaria, and rash that required stopping the drug for about one month. She restarted tamoxifen  10 mg daily without recurrent allergic symptoms. She asked about efficacy of the 10 mg dose and the option to remain on this dose if higher dosing is not tolerated, with acknowledgment of limited data for invasive disease.  She denies swelling or lymphedema after surgery. She has persistent shoulder soreness with certain movements and exercises. She denies radiation-related swelling or other late-term effects and denies symptoms concerning for endometrial or uterine disease. She is up to date with gynecologic care, Pap smears, and colonoscopy.   She is interested  in additional surveillance options, including contrast-enhanced diagnostic mammography and  Garden Reveal blood testing for minimal residual disease and early recurrence detection, and asked about feasibility and insurance coverage.    REVIEW OF SYSTEMS:  Review of Systems - Oncology Breast: Denies any new nodularity, masses, tenderness, nipple changes, or nipple discharge.       PAST MEDICAL/SURGICAL HISTORY:  Past Medical History:  Diagnosis Date   Breast cancer (HCC) 10/2023   Left Breast   Family history of brain cancer    Family history of pancreatic cancer    Family history of prostate cancer    Family history of uterine cancer    Past Surgical History:  Procedure Laterality Date   AXILLARY SENTINEL NODE BIOPSY Left 11/15/2023   Procedure: BIOPSY, LYMPH NODE, SENTINEL, AXILLARY;  Surgeon: Ebbie Cough, MD;  Location: [facility];  Service: General;  Laterality: Left;   BREAST BIOPSY Left 10/11/2023   US  LT BREAST BX W LOC DEV 1ST LESION IMG BX SPEC US  GUIDE 10/11/2023 [facility] MAMMOGRAPHY   BREAST BIOPSY  11/14/2023   MM LT RADIOACTIVE SEED LOC MAMMO GUIDE 11/14/2023 [facility] MAMMOGRAPHY   BREAST LUMPECTOMY WITH RADIOACTIVE SEED LOCALIZATION Left 11/15/2023   Procedure: BREAST LUMPECTOMY WITH RADIOACTIVE SEED LOCALIZATION;  Surgeon: Ebbie Cough, MD;  Location: [facility];  Service: General;  Laterality: Left;   MENISCUS REPAIR Right      ALLERGIES:  Allergies[1]   CURRENT MEDICATIONS:  Outpatient Encounter Medications as of 04/11/2024  Medication Sig   tamoxifen  (NOLVADEX ) 10 MG tablet Take 1 tablet (10 mg total) by mouth daily.   No facility-administered encounter medications on file as of 04/11/2024.     ONCOLOGIC FAMILY HISTORY:  Family History  Problem Relation Age of Onset   Lung cancer Father    Prostate cancer Father 45   Brain cancer Paternal Uncle    Uterine cancer Maternal Grandmother        dx < 50   Pancreatic cancer Maternal Grandfather    Acute myelogenous leukemia Paternal Cousin  17       paternal first cousin's son     SOCIAL HISTORY:  Social History   Socioeconomic History   Marital status: Married    Spouse name: Not on file   Number of children: 3   Years of education: Not on file   Highest education level: Not on file  Occupational History   Not on file  Tobacco Use   Smoking status: Never   Smokeless tobacco: Never  Vaping Use   Vaping status: Never Used  Substance and Sexual Activity   Alcohol use: Yes    Comment: occasional   Drug use: Never   Sexual activity: Not on file  Other Topics Concern   Not on file  Social History Narrative   Not on file   Social Drivers of Health   Tobacco Use: Low Risk (04/11/2024)   Patient History    Smoking Tobacco Use: Never    Smokeless Tobacco Use: Never    Passive Exposure: Not on file  Financial Resource Strain: Low Risk (04/11/2024)   Overall Financial Resource Strain (CARDIA)    Difficulty of Paying Living Expenses: Not hard at all  Food Insecurity: No Food Insecurity (04/11/2024)   Epic    Worried About Radiation Protection Practitioner of Food in the Last Year: Never true    Ran Out of Food in the Last Year: Never true  Transportation Needs: No Transportation Needs (04/11/2024)   Epic  Lack of Transportation (Medical): No    Lack of Transportation (Non-Medical): No  Physical Activity: Insufficiently Active (04/11/2024)   Exercise Vital Sign    Days of Exercise per Week: 3 days    Minutes of Exercise per Session: 30 min  Stress: No Stress Concern Present (04/11/2024)   [employer] of Occupational Health - Occupational Stress Questionnaire    Feeling of Stress: Not at all  Social Connections: Socially Integrated (04/11/2024)   Social Connection and Isolation Panel    Frequency of Communication with Friends and Family: Three times a week    Frequency of Social Gatherings with Friends and Family: Three times a week    Attends Religious Services: More than 4 times per year     Active Member of Clubs or Organizations: Yes    Attends Banker Meetings: 1 to 4 times per year    Marital Status: Married  Catering Manager Violence: Not At Risk (04/11/2024)   Epic    Fear of Current or Ex-Partner: No    Emotionally Abused: No    Physically Abused: No    Sexually Abused: No  Depression (PHQ2-9): Low Risk (04/11/2024)   Depression (PHQ2-9)    PHQ-2 Score: 0  Alcohol Screen: Low Risk (04/11/2024)   Alcohol Screen    Last Alcohol Screening Score (AUDIT): 1  Housing: Unknown (04/11/2024)   Epic    Unable to Pay for Housing in the Last Year: No    Number of Times Moved in the Last Year: Not on file    Homeless in the Last Year: No  Utilities: Not At Risk (04/11/2024)   Epic    Threatened with loss of utilities: No  Health Literacy: Adequate Health Literacy (04/11/2024)   B1300 Health Literacy    Frequency of need for help with medical instructions: Never     OBSERVATIONS/OBJECTIVE:  BP (!) 106/48 (BP Location: Right Arm, Patient Position: Sitting) Comment: Nurse notified  Pulse 71   Temp 98.1 F (36.7 C) (Temporal)   Resp 16   Ht 5' 6 (1.676 m)   Wt 105 lb 3.2 oz (47.7 kg)   SpO2 100%   BMI 16.98 kg/m  GENERAL: Patient is a well appearing female in no acute distress HEENT:  Sclerae anicteric.  Oropharynx clear and moist. No ulcerations or evidence of oropharyngeal candidiasis. Neck is supple.  NODES:  No cervical, supraclavicular, or axillary lymphadenopathy palpated.  BREAST EXAM:  Deferred. LUNGS:  Clear to auscultation bilaterally.  No wheezes or rhonchi. HEART:  Regular rate and rhythm. No murmur appreciated. ABDOMEN:  Soft, nontender.  Positive, normoactive bowel sounds. No organomegaly palpated. MSK:  No focal spinal tenderness to palpation. Full range of motion bilaterally in the upper extremities. EXTREMITIES:  No peripheral edema.   SKIN:  Clear with no obvious rashes or skin changes. No nail dyscrasia. NEURO:   Nonfocal. Well oriented.  Appropriate affect.   LABORATORY DATA:  None for this visit.  DIAGNOSTIC IMAGING:  None for this visit.      ASSESSMENT AND PLAN:  Ms.. Faye is a pleasant 49 y.o. female with Stage IA left breast invasive lobular carcinoma, ER+/PR+/HER2-, diagnosed in 09/2023, treated with lumpectomy, adjuvant radiation therapy, and anti-estrogen therapy with Tamoxifen  beginning in 01/2024.  She presents to the Survivorship Clinic for our initial meeting and routine follow-up post-completion of treatment for breast cancer.    1. Stage IA left breast cancer:  Ms. Burkemper is continuing to recover from definitive treatment for breast cancer. She  will follow-up with her medical oncologist, Dr.  Lanny in 6 months with history and physical exam per surveillance protocol.  She will continue her anti-estrogen therapy with ***. Thus far, she is tolerating the *** well, with minimal side effects. Her mammogram is due ***; orders placed today.   Today, a comprehensive survivorship care plan and treatment summary was reviewed with the patient today detailing her breast cancer diagnosis, treatment course, potential late/long-term effects of treatment, appropriate follow-up care with recommendations for the future, and patient education resources.  A copy of this summary, along with a letter will be sent to the patients primary care provider via mail/fax/In Basket message after todays visit.    #. Problem(s) at Visit______________  #. Bone health:  Given Ms. Louk's age/history of breast cancer and her current treatment regimen including anti-estrogen therapy with ***, she is at risk for bone demineralization.  Her last DEXA scan was ***, which showed ***.  In the meantime, she was encouraged to increase her consumption of foods rich in calcium, as well as increase her weight-bearing activities.  She was given education on specific activities to promote bone health.  #. Cancer screening:  Due to  Ms. Yurchak's history and her age, she should receive screening for skin cancers, colon cancer, and gynecologic cancers.  The information and recommendations are listed on the patient's comprehensive care plan/treatment summary and were reviewed in detail with the patient.    #. Health maintenance and wellness promotion: Ms. Pegues was encouraged to consume 5-7 servings of fruits and vegetables per day. We reviewed the Nutrition Rainbow handout.  She was also encouraged to engage in moderate to vigorous exercise for 30 minutes per day most days of the week.  She was instructed to limit her alcohol consumption and continue to abstain from tobacco use/***was encouraged stop smoking.     #. Support services/counseling: It is not uncommon for this period of the patient's cancer care trajectory to be one of many emotions and stressors.   She was given information regarding our available services and encouraged to contact me with any questions or for help enrolling in any of our support group/programs.    Follow up instructions:    -Return to cancer center ***  -Mammogram due in *** -She is welcome to return back to the Survivorship Clinic at any time; no additional follow-up needed at this time.  -Consider referral back to survivorship as a long-term survivor for continued surveillance  The patient was provided an opportunity to ask questions and all were answered. The patient agreed with the plan and demonstrated an understanding of the instructions.   Total encounter time:*** minutes*in face-to-face visit time, chart review, lab review, care coordination, order entry, and documentation of the encounter time.    Morna Kendall, NP 04/11/2024 8:55 AM Medical Oncology and Hematology [facility] [address] [place], [place] [address] Tel. [phone]    Fax. [phone]  *Total Encounter Time as defined by the Centers for Medicare and Medicaid Services includes, in addition  to the face-to-face time of a patient visit (documented in the note above) non-face-to-face time: obtaining and reviewing outside history, ordering and reviewing medications, tests or procedures, care coordination (communications with other health care professionals or caregivers) and documentation in the medical record.      [1] No Known Allergies

## 2024-04-12 LAB — CANCER ANTIGEN 27.29: CA 27.29: 9.5 U/mL (ref 0.0–38.6)

## 2024-05-01 ENCOUNTER — Encounter: Payer: Self-pay | Admitting: Adult Health

## 2024-05-02 ENCOUNTER — Encounter: Admitting: Adult Health

## 2024-05-02 LAB — GUARDANT REVEAL

## 2024-06-03 ENCOUNTER — Ambulatory Visit: Attending: General Surgery

## 2024-06-21 ENCOUNTER — Inpatient Hospital Stay: Attending: Radiation Oncology | Admitting: General Practice

## 2024-07-30 ENCOUNTER — Ambulatory Visit: Admitting: Hematology

## 2024-07-30 ENCOUNTER — Other Ambulatory Visit

## 2024-09-30 ENCOUNTER — Inpatient Hospital Stay: Admitting: Hematology

## 2024-09-30 ENCOUNTER — Inpatient Hospital Stay: Attending: Radiation Oncology
